# Patient Record
Sex: Female | Born: 1960 | Race: White | Hispanic: No | Marital: Married | Smoking: Former smoker
Health system: Southern US, Community
[De-identification: ages and names within clinical notes are randomized; demographics above are authoritative.]

## PROBLEM LIST (undated history)

## (undated) DIAGNOSIS — Z8711 Personal history of peptic ulcer disease: Secondary | ICD-10-CM

## (undated) DIAGNOSIS — I10 Essential (primary) hypertension: Secondary | ICD-10-CM

## (undated) DIAGNOSIS — G5603 Carpal tunnel syndrome, bilateral upper limbs: Secondary | ICD-10-CM

## (undated) DIAGNOSIS — Z8669 Personal history of other diseases of the nervous system and sense organs: Secondary | ICD-10-CM

## (undated) DIAGNOSIS — Z862 Personal history of diseases of the blood and blood-forming organs and certain disorders involving the immune mechanism: Secondary | ICD-10-CM

## (undated) DIAGNOSIS — E538 Deficiency of other specified B group vitamins: Secondary | ICD-10-CM

## (undated) DIAGNOSIS — G4700 Insomnia, unspecified: Secondary | ICD-10-CM

## (undated) HISTORY — DX: Personal history of peptic ulcer disease: Z87.11

## (undated) HISTORY — DX: Carpal tunnel syndrome, bilateral upper limbs: G56.03

## (undated) HISTORY — DX: Insomnia, unspecified: G47.00

## (undated) HISTORY — DX: Personal history of diseases of the blood and blood-forming organs and certain disorders involving the immune mechanism: Z86.2

## (undated) HISTORY — DX: Personal history of other diseases of the nervous system and sense organs: Z86.69

## (undated) HISTORY — DX: Deficiency of other specified B group vitamins: E53.8

## (undated) HISTORY — PX: OTHER SURGICAL HISTORY: SHX169

---

## 2010-09-11 HISTORY — PX: LUMBAR FUSION: SHX111

## 2014-04-27 ENCOUNTER — Ambulatory Visit: Payer: Self-pay | Admitting: Pain Medicine

## 2014-06-17 ENCOUNTER — Encounter: Payer: Self-pay | Admitting: Physical Medicine & Rehabilitation

## 2014-06-19 ENCOUNTER — Ambulatory Visit: Payer: Self-pay | Admitting: Pain Medicine

## 2014-06-19 LAB — BASIC METABOLIC PANEL
Anion Gap: 7 (ref 7–16)
BUN: 7 mg/dL (ref 7–18)
Calcium, Total: 8.4 mg/dL — ABNORMAL LOW (ref 8.5–10.1)
Chloride: 109 mmol/L — ABNORMAL HIGH (ref 98–107)
Co2: 27 mmol/L (ref 21–32)
Creatinine: 0.6 mg/dL (ref 0.60–1.30)
EGFR (African American): >60
GLUCOSE: 104 mg/dL — AB (ref 65–99)
Osmolality: 283 (ref 275–301)
POTASSIUM: 4.1 mmol/L (ref 3.5–5.1)
SODIUM: 143 mmol/L (ref 136–145)

## 2014-06-19 LAB — HEPATIC FUNCTION PANEL A (ARMC)
ALBUMIN: 3.6 g/dL (ref 3.4–5.0)
AST: 31 U/L (ref 15–37)
Alkaline Phosphatase: 68 U/L
Bilirubin,Total: 0.2 mg/dL (ref 0.2–1.0)
SGPT (ALT): 41 U/L
Total Protein: 7.2 g/dL (ref 6.4–8.2)

## 2014-06-19 LAB — MAGNESIUM: MAGNESIUM: 1.9 mg/dL

## 2014-06-19 LAB — SEDIMENTATION RATE: Erythrocyte Sed Rate: 8 mm/hr (ref 0–30)

## 2014-06-22 ENCOUNTER — Ambulatory Visit: Payer: Self-pay | Admitting: Pain Medicine

## 2014-07-06 ENCOUNTER — Ambulatory Visit: Payer: Self-pay | Admitting: Pain Medicine

## 2014-07-17 ENCOUNTER — Ambulatory Visit (HOSPITAL_BASED_OUTPATIENT_CLINIC_OR_DEPARTMENT_OTHER): Payer: Worker's Compensation | Admitting: Physical Medicine & Rehabilitation

## 2014-07-17 ENCOUNTER — Encounter: Payer: Worker's Compensation | Attending: Physical Medicine & Rehabilitation

## 2014-07-17 ENCOUNTER — Encounter: Payer: Self-pay | Admitting: Physical Medicine & Rehabilitation

## 2014-07-17 VITALS — HR 90 | Resp 14 | Ht 67.0 in | Wt 188.0 lb

## 2014-07-17 DIAGNOSIS — M79604 Pain in right leg: Secondary | ICD-10-CM | POA: Insufficient documentation

## 2014-07-17 NOTE — Progress Notes (Signed)
   Subjective:    Patient ID: Shawna Carrillo, female    DOB: 11/14/1960, 53 y.o.   MRN: [id number]  HPI  Pain Inventory Average Pain 6 Pain Right Now 7 My pain is constant, burning, stabbing, tingling and aching  In the last 24 hours, has pain interfered with the following? General activity 7 Relation with others 0 Enjoyment of life 5 What TIME of day is your pain at its worst? morning  AND NIGHT  Sleep (in general) Poor  Pain is worse with: walking, bending, sitting, standing and some activites Pain improves with: pacing activities, medication and POSITIONAL CHANGES Relief from Meds: 5  Mobility use a cane how many minutes can you walk? 3-5 ability to climb steps?  yes do you drive?  yes  Function disabled: date disabled 2012  Neuro/Psych weakness numbness tremor tingling trouble walking spasms  Prior Studies Any changes since last visit?  no  Physicians involved in your care Any changes since last visit?  no   Family History  Problem Relation Age of Onset  . Diabetes Mother   . Heart disease Father    History   Social History  . Marital Status: Unknown    Spouse Name: N/A    Number of Children: N/A  . Years of Education: N/A   Social History Main Topics  . Smoking status: Current Every Day Smoker -- 1.00 packs/day for 20 years    Types: Cigarettes  . Smokeless tobacco: None  . Alcohol Use: No  . Drug Use: No  . Sexual Activity: None   Other Topics Concern  . None   Social History Narrative  . None   Past Surgical History  Procedure Laterality Date  . Lumbar fusion  2012    FAILED   History reviewed. No pertinent past medical history. Pulse 90  Resp 14  Ht 5\' 7"  (1.702 m)  Wt 188 lb (85.276 kg)  BMI 29.44 kg/m2  SpO2 93%  Opioid Risk Score:   Fall Risk Score: Low Fall Risk (0-5 points)   Review of Systems     Objective:   Physical Exam        Assessment & Plan:    EMG performed 07/17/2014.  See EMG report under  media tab.  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

## 2014-07-21 ENCOUNTER — Encounter: Payer: Self-pay | Admitting: Physical Medicine & Rehabilitation

## 2014-12-29 ENCOUNTER — Ambulatory Visit
Admit: 2014-12-29 | Disposition: A | Discharge status: Home / Self Care | Payer: Self-pay | Attending: Hematology and Oncology | Admitting: Hematology and Oncology

## 2014-12-29 LAB — IRON AND TIBC
Iron Bind.Cap.(Total): 349 (ref 250–450)
Iron Saturation: 23.8
Iron: 83 ug/dL
Unbound Iron-Bind.Cap.: 266.3

## 2014-12-29 LAB — CBC CANCER CENTER
Basophil #: 0.1 x10 3/mm (ref 0.0–0.1)
Basophil %: 1 %
Eosinophil #: 0.1 x10 3/mm (ref 0.0–0.7)
Eosinophil %: 2.4 %
HCT: 39.7 % (ref 35.0–47.0)
HGB: 13.6 g/dL (ref 12.0–16.0)
Lymphocyte #: 2 x10 3/mm (ref 1.0–3.6)
Lymphocyte %: 35.6 %
MCH: 31.7 pg (ref 26.0–34.0)
MCHC: 34.1 g/dL (ref 32.0–36.0)
MCV: 93 fL (ref 80–100)
Monocyte #: 0.3 x10 3/mm (ref 0.2–0.9)
Monocyte %: 5.6 %
Neutrophil #: 3.1 x10 3/mm (ref 1.4–6.5)
Neutrophil %: 55.4 %
Platelet: 380 x10 3/mm (ref 150–440)
RBC: 4.28 10*6/uL (ref 3.80–5.20)
RDW: 13.6 % (ref 11.5–14.5)
WBC: 5.7 x10 3/mm (ref 3.6–11.0)

## 2014-12-29 LAB — SEDIMENTATION RATE: Erythrocyte Sed Rate: 11 mm/hr (ref 0–30)

## 2014-12-29 LAB — FOLATE: Folic Acid: 11.2 ng/mL

## 2014-12-29 LAB — FERRITIN: Ferritin (ARMC): 50 ng/mL

## 2015-02-17 ENCOUNTER — Encounter: Payer: Self-pay | Admitting: Family Medicine

## 2015-02-17 ENCOUNTER — Ambulatory Visit (INDEPENDENT_AMBULATORY_CARE_PROVIDER_SITE_OTHER): Payer: BLUE CROSS/BLUE SHIELD | Admitting: Family Medicine

## 2015-02-17 VITALS — BP 102/68 | HR 96 | Resp 15 | Ht 67.0 in | Wt 187.2 lb

## 2015-02-17 DIAGNOSIS — M5442 Lumbago with sciatica, left side: Secondary | ICD-10-CM

## 2015-02-17 DIAGNOSIS — M5441 Lumbago with sciatica, right side: Secondary | ICD-10-CM

## 2015-02-17 DIAGNOSIS — K220 Achalasia of cardia: Secondary | ICD-10-CM | POA: Insufficient documentation

## 2015-02-17 DIAGNOSIS — F5104 Psychophysiologic insomnia: Secondary | ICD-10-CM | POA: Insufficient documentation

## 2015-02-17 DIAGNOSIS — G8929 Other chronic pain: Secondary | ICD-10-CM | POA: Insufficient documentation

## 2015-02-17 DIAGNOSIS — G4700 Insomnia, unspecified: Secondary | ICD-10-CM | POA: Diagnosis not present

## 2015-02-17 DIAGNOSIS — G4762 Sleep related leg cramps: Secondary | ICD-10-CM | POA: Insufficient documentation

## 2015-02-17 DIAGNOSIS — R7989 Other specified abnormal findings of blood chemistry: Secondary | ICD-10-CM | POA: Insufficient documentation

## 2015-02-17 MED ORDER — ESZOPICLONE 3 MG PO TABS: 3.0000 mg | ORAL_TABLET | Freq: Every day | ORAL | Status: DC

## 2015-02-17 MED ORDER — ROPINIROLE HCL 1 MG PO TABS: 1.0000 mg | ORAL_TABLET | Freq: Every day | ORAL | Status: DC

## 2015-02-17 NOTE — Progress Notes (Signed)
Name: Shawna Carrillo   MRN: [id number]    DOB: November 01, 1960   Date:02/17/2015       Progress Note  Subjective  Chief Complaint  Chief Complaint  Patient presents with  . Follow-up    1 month   . Insomnia  HPI  Pt. Is here for follow up of Insomnia. She is on Lunesta '2mg'$  at bedtime and notes that it still takes her over 1 hour to fall asleep. She has unrefreshing sleep. In addition, she has restless leg syndrome and leg cramping. She is on Requip '1mg'$  at bedtime, and is not working as well asit did in the beginning.     Past Medical History  Diagnosis Date  . B12 deficiency   . Insomnia    Past Surgical History  Procedure Laterality Date  . Lumbar fusion  2012    FAILED   Family History  Problem Relation Age of Onset  . Diabetes Mother   . Heart disease Father       History  Substance Use Topics  . Smoking status: Current Every Day Smoker -- 1.00 packs/day for 20 years    Types: Cigarettes  . Smokeless tobacco: Never Used  . Alcohol Use: No     Current outpatient prescriptions:  .  rOPINIRole (REQUIP) 1 MG tablet, Take 1 capsule by mouth at bedtime., Disp: , Rfl:  .  DULoxetine (CYMBALTA) 60 MG capsule, Take 1 capsule by mouth daily., Disp: , Rfl:  .  eszopiclone (LUNESTA) 2 MG TABS tablet, Take 2 mg by mouth at bedtime., Disp: , Rfl: 0 .  gabapentin (NEURONTIN) 300 MG capsule, Take 300 mg by mouth daily., Disp: , Rfl:  .  traMADol (ULTRAM) 50 MG tablet, Take by mouth every 6 (six) hours as needed., Disp: , Rfl:  .  TraMADol HCl 100 MG CP24, Take 1 tablet by mouth 4 (four) times daily as needed., Disp: , Rfl:   Allergies  Allergen Reactions  . Codeine   . Latex     Review of Systems  Neurological: Negative for dizziness and seizures.  Psychiatric/Behavioral: Negative for depression. The patient has insomnia.       Objective  Filed Vitals:   02/17/15 1408  BP: 102/68  Pulse: 96  Resp: 15  Height: '5\' 7"'$  (1.702 m)  Weight: 187 lb 3.2 oz (84.913 kg)   SpO2: 96%     Physical Exam  Constitutional: She is oriented to person, place, and time and well-developed, well-nourished, and in no distress.  HENT:  Head: Normocephalic.  Cardiovascular: Normal rate.   Pulmonary/Chest: Effort normal.  Musculoskeletal:       Right ankle: She exhibits no swelling.       Left ankle: She exhibits no swelling.  Neurological: She is alert and oriented to person, place, and time.  Psychiatric: Affect normal.  Nursing note and vitals reviewed.        Assessment & Plan  1. Chronic insomnia We will increase Lunesta from 2 mg at bedtime to 3 mg at bedtime for insomnia. Refills provided. Follow-up in 4 weeks. - Eszopiclone (ESZOPICLONE) 3 MG TABS; Take 1 tablet (3 mg total) by mouth at bedtime. Take immediately before bedtime  Dispense: 30 tablet; Refill: 0  2. Nocturnal leg cramps Symptoms consistent with restless leg syndrome, previously well controlled on Requip. We will obtain labs including electrolytes and vitamin B12 levels and follow-up. - rOPINIRole (REQUIP) 1 MG tablet; Take 1 tablet (1 mg total) by mouth at bedtime.  Dispense: 30  tablet; Refill: 2 - Vitamin B12 - Comprehensive metabolic panel    Marvens Hollars Asad [facility] 02/17/2015 2:19 PM

## 2015-02-18 LAB — COMPREHENSIVE METABOLIC PANEL
ALK PHOS: 63 IU/L (ref 39–117)
ALT: 23 IU/L (ref 0–32)
AST: 24 IU/L (ref 0–40)
Albumin/Globulin Ratio: 1.6 (ref 1.1–2.5)
Albumin: 4.6 g/dL (ref 3.5–5.5)
BUN / CREAT RATIO: 11 (ref 9–23)
BUN: 8 mg/dL (ref 6–24)
CALCIUM: 10.2 mg/dL (ref 8.7–10.2)
CO2: 26 mmol/L (ref 18–29)
Chloride: 103 mmol/L (ref 97–108)
Creatinine, Ser: 0.71 mg/dL (ref 0.57–1.00)
GFR calc Af Amer: 111 mL/min/{1.73_m2} (ref >59)
GFR calc non Af Amer: 96 mL/min/{1.73_m2} (ref >59)
GLUCOSE: 83 mg/dL (ref 65–99)
Globulin, Total: 2.8 g/dL (ref 1.5–4.5)
Potassium: 4.5 mmol/L (ref 3.5–5.2)
SODIUM: 142 mmol/L (ref 134–144)
TOTAL PROTEIN: 7.4 g/dL (ref 6.0–8.5)

## 2015-02-18 LAB — VITAMIN B12: VITAMIN B 12: 243 pg/mL (ref 211–946)

## 2015-03-17 ENCOUNTER — Ambulatory Visit: Payer: BLUE CROSS/BLUE SHIELD | Admitting: Family Medicine

## 2015-03-23 ENCOUNTER — Ambulatory Visit: Payer: BLUE CROSS/BLUE SHIELD | Admitting: Family Medicine

## 2015-03-30 ENCOUNTER — Ambulatory Visit: Payer: BLUE CROSS/BLUE SHIELD | Admitting: Family Medicine

## 2015-04-06 ENCOUNTER — Ambulatory Visit: Payer: BLUE CROSS/BLUE SHIELD | Admitting: Family Medicine

## 2015-06-30 ENCOUNTER — Telehealth: Payer: Self-pay | Admitting: Pain Medicine

## 2015-06-30 NOTE — Telephone Encounter (Signed)
[facility] in [place] needs prior authorization to fill scripts please

## 2015-07-12 ENCOUNTER — Ambulatory Visit: Payer: Worker's Compensation | Attending: Pain Medicine | Admitting: Pain Medicine

## 2015-07-12 ENCOUNTER — Encounter: Payer: Self-pay | Admitting: Pain Medicine

## 2015-07-12 ENCOUNTER — Other Ambulatory Visit: Payer: Self-pay | Admitting: Pain Medicine

## 2015-07-12 VITALS — BP 134/85 | HR 89 | Temp 98.4°F | Resp 20 | Ht 66.0 in | Wt 190.0 lb

## 2015-07-12 DIAGNOSIS — F1190 Opioid use, unspecified, uncomplicated: Secondary | ICD-10-CM

## 2015-07-12 DIAGNOSIS — D649 Anemia, unspecified: Secondary | ICD-10-CM | POA: Diagnosis not present

## 2015-07-12 DIAGNOSIS — E785 Hyperlipidemia, unspecified: Secondary | ICD-10-CM | POA: Diagnosis not present

## 2015-07-12 DIAGNOSIS — Z8711 Personal history of peptic ulcer disease: Secondary | ICD-10-CM | POA: Diagnosis not present

## 2015-07-12 DIAGNOSIS — M961 Postlaminectomy syndrome, not elsewhere classified: Secondary | ICD-10-CM

## 2015-07-12 DIAGNOSIS — G8929 Other chronic pain: Secondary | ICD-10-CM | POA: Diagnosis not present

## 2015-07-12 DIAGNOSIS — E782 Mixed hyperlipidemia: Secondary | ICD-10-CM | POA: Insufficient documentation

## 2015-07-12 DIAGNOSIS — Z862 Personal history of diseases of the blood and blood-forming organs and certain disorders involving the immune mechanism: Secondary | ICD-10-CM

## 2015-07-12 DIAGNOSIS — K219 Gastro-esophageal reflux disease without esophagitis: Secondary | ICD-10-CM | POA: Diagnosis not present

## 2015-07-12 DIAGNOSIS — F1120 Opioid dependence, uncomplicated: Secondary | ICD-10-CM

## 2015-07-12 DIAGNOSIS — M545 Low back pain: Secondary | ICD-10-CM | POA: Diagnosis not present

## 2015-07-12 DIAGNOSIS — G43909 Migraine, unspecified, not intractable, without status migrainosus: Secondary | ICD-10-CM | POA: Diagnosis not present

## 2015-07-12 DIAGNOSIS — M47816 Spondylosis without myelopathy or radiculopathy, lumbar region: Secondary | ICD-10-CM | POA: Insufficient documentation

## 2015-07-12 DIAGNOSIS — Z5181 Encounter for therapeutic drug level monitoring: Secondary | ICD-10-CM

## 2015-07-12 DIAGNOSIS — M47896 Other spondylosis, lumbar region: Secondary | ICD-10-CM

## 2015-07-12 DIAGNOSIS — Z8669 Personal history of other diseases of the nervous system and sense organs: Secondary | ICD-10-CM

## 2015-07-12 DIAGNOSIS — M79606 Pain in leg, unspecified: Secondary | ICD-10-CM | POA: Diagnosis present

## 2015-07-12 DIAGNOSIS — Z79899 Other long term (current) drug therapy: Secondary | ICD-10-CM | POA: Diagnosis not present

## 2015-07-12 DIAGNOSIS — M549 Dorsalgia, unspecified: Secondary | ICD-10-CM

## 2015-07-12 DIAGNOSIS — G894 Chronic pain syndrome: Secondary | ICD-10-CM | POA: Insufficient documentation

## 2015-07-12 DIAGNOSIS — Z87891 Personal history of nicotine dependence: Secondary | ICD-10-CM | POA: Diagnosis not present

## 2015-07-12 DIAGNOSIS — M539 Dorsopathy, unspecified: Secondary | ICD-10-CM

## 2015-07-12 DIAGNOSIS — Z79891 Long term (current) use of opiate analgesic: Secondary | ICD-10-CM | POA: Diagnosis not present

## 2015-07-12 HISTORY — DX: Personal history of other diseases of the nervous system and sense organs: Z86.69

## 2015-07-12 HISTORY — DX: Personal history of peptic ulcer disease: Z87.11

## 2015-07-12 HISTORY — DX: Personal history of diseases of the blood and blood-forming organs and certain disorders involving the immune mechanism: Z86.2

## 2015-07-12 MED ORDER — TRAMADOL HCL 50 MG PO TABS: 100.0000 mg | ORAL_TABLET | Freq: Four times a day (QID) | ORAL | Status: DC | PRN

## 2015-07-12 MED ORDER — DULOXETINE HCL 60 MG PO CPEP: 60.0000 mg | ORAL_CAPSULE | Freq: Every day | ORAL | Status: DC

## 2015-07-12 NOTE — Progress Notes (Signed)
Safety precautions to be maintained throughout the outpatient stay will include: orient to surroundings, keep bed in low position, maintain call bell within reach at all times, provide assistance with transfer out of bed and ambulation. Did not bring pills for pill count

## 2015-07-12 NOTE — Progress Notes (Signed)
Patient's Name: Shawna Carrillo MRN: [id number] DOB: 08/23/1961 DOS: 07/12/2015  Primary Reason(s) for Visit: Encounter for Medication Management. CC: Back Pain and Leg Pain   HPI:   Shawna Carrillo is a 54 y.o. year old, female patient, who returns today as an established patient. She has Chronic low back pain; Nocturnal leg cramps; Chronic insomnia; Elevated platelet count (Shawna Carrillo); Achalasia; Chronic pain; Long term current use of opiate analgesic; Long term prescription opiate use; Opiate use; Encounter for therapeutic drug level monitoring; Chronic leg pain; Failed back surgical syndrome; Opiate dependence (Shawna Carrillo); Lumbar spondylosis; Lumbar facet syndrome; History of migraine; Hyperlipidemia; GERD (gastroesophageal reflux disease); History of peptic ulcer disease; and History of anemia on her problem list.. Her primarily concern today is the Back Pain and Leg Pain    The patient comes in indicating that she is doing great and she does not need any changes in her medications or any injections at this point. She indicated that she will use a call if she needs [place]. Today's Pain Score: 6  Pain Type: Chronic pain Pain Location: Back (leg) Pain Orientation: Lower Pain Descriptors / Indicators: Radiating, Throbbing (deep) Pain Frequency: Constant  Date of Last Visit: Date of Last Visit: 04/27/15 Service Provided on Last Visit: Service Provided on Last Visit: Med Refill  Pharmacotherapy Review:   Side-effects or Adverse reactions: None reported. Effectiveness: Described as relatively effective, allowing for increase in activities of daily living (ADL). Onset of action: Within expected pharmacological parameters. Duration of action: Within normal limits for medication. Peak effect: Timing and results are as within normal expected parameters. [place] PMP: Compliant with practice rules and regulations. DST: Compliant with practice rules and regulations. Lab work: No new labs ordered by our  practice. Treatment compliance: Compliant. Substance Use Disorder (SUD) Risk Level: Low Planned course of action: Continue therapy as is.  Allergies: Shawna Carrillo is allergic to latex and codeine.  Meds: The patient has a current medication list which includes the following prescription(s): duloxetine and tramadol. Requested Prescriptions   Signed Prescriptions Disp Refills  . DULoxetine (CYMBALTA) 60 MG capsule 30 capsule 5    Sig: Take 1 capsule (60 mg total) by mouth daily.  . traMADol (ULTRAM) 50 MG tablet 240 tablet 5    Sig: Take 2 tablets (100 mg total) by mouth every 6 (six) hours as needed.    ROS: Constitutional: Afebrile, no chills, well hydrated and well nourished Gastrointestinal: negative Musculoskeletal:negative Neurological: negative Behavioral/Psych: negative  PFSH: Medical:  Shawna Carrillo  has a past medical history of B12 deficiency; Insomnia; History of migraine (07/12/2015); History of peptic ulcer disease (07/12/2015); and History of anemia (07/12/2015). Family: family history includes Diabetes in her mother; Heart disease in her father. Surgical:  has past surgical history that includes Lumbar fusion (2012) and hilar myectomy. Tobacco:  reports that she has quit smoking. Her smoking use included Cigarettes. She has a 20 pack-year smoking history. She has never used smokeless tobacco. Alcohol:  reports that she does not drink alcohol. Drug:  reports that she does not use illicit drugs.  Physical Exam: Vitals:  Today's Vitals   07/12/15 1348 07/12/15 1349  BP:  134/85  Pulse: 89   Temp: 98.4 F (36.9 C)   Resp: 20   Height: '5\' 6"'$  (1.676 m)   Weight: 190 lb (86.183 kg)   SpO2: 100%   PainSc: 6  6   PainLoc: Back   Calculated BMI: Body mass index is 30.68 kg/(m^2). General appearance: alert, cooperative, appears stated age,  no distress and mildly obese Eyes: conjunctivae/corneas clear. PERRL, EOM's intact. Fundi benign. Lungs: No evidence  respiratory distress, no audible rales or ronchi and no use of accessory muscles of respiration Neck: no adenopathy, no carotid bruit, no JVD, supple, symmetrical, trachea midline and thyroid not enlarged, symmetric, no tenderness/mass/nodules Back: symmetric, no curvature. ROM normal. No CVA tenderness. Extremities: extremities normal, atraumatic, no cyanosis or edema Pulses: 2+ and symmetric Skin: Skin color, texture, turgor normal. No rashes or lesions Neurologic: Grossly normal    Assessment: Encounter Diagnosis:  Primary Diagnosis: Chronic pain [G89.29]  Plan: Shawna Carrillo was seen today for back pain and leg pain.  Diagnoses and all orders for this visit:  Chronic pain -     DULoxetine (CYMBALTA) 60 MG capsule; Take 1 capsule (60 mg total) by mouth daily. -     traMADol (ULTRAM) 50 MG tablet; Take 2 tablets (100 mg total) by mouth every 6 (six) hours as needed.  Long term current use of opiate analgesic -     Drugs of abuse screen w/o alc, rtn urine-sln; Future  Long term prescription opiate use  Opiate use  Encounter for therapeutic drug level monitoring  Chronic low back pain  Chronic leg pain, unspecified laterality  Failed back surgical syndrome  Uncomplicated opioid dependence ([place])  Other osteoarthritis of spine, lumbar region  Lumbar facet syndrome  History of migraine  Hyperlipidemia  Gastroesophageal reflux disease without esophagitis  History of peptic ulcer disease  History of anemia     There are no Patient Instructions on file for this visit. Medications discontinued today:  Medications Discontinued During This Encounter  Medication Reason  . TraMADol HCl 016 MG WF09 Duplicate  . DULoxetine (CYMBALTA) 60 MG capsule Reorder  . traMADol (ULTRAM) 50 MG tablet Reorder  . Eszopiclone (ESZOPICLONE) 3 MG TABS Error  . rOPINIRole (REQUIP) 1 MG tablet Error   Medications administered today:  [name] had no medications administered during this  visit.  Primary Care Physician: Keith Rake, MD Location: [facility] Note by: Gaytha Raybourn A. Dossie Arbour, M.D, DABA, DABAPM, DABPM, DABIPP, FIPP

## 2015-07-18 LAB — TOXASSURE SELECT 13 (MW), URINE: PDF: 0

## 2015-07-26 NOTE — Progress Notes (Signed)
Quick Note:  Results of UDT were consistent with expected results. No further intervention warranted at this time. ______

## 2015-08-02 ENCOUNTER — Other Ambulatory Visit: Payer: Self-pay | Admitting: Pain Medicine

## 2015-12-01 ENCOUNTER — Other Ambulatory Visit: Payer: Self-pay

## 2015-12-01 ENCOUNTER — Ambulatory Visit: Payer: BLUE CROSS/BLUE SHIELD | Admitting: Gastroenterology

## 2015-12-30 ENCOUNTER — Ambulatory Visit: Payer: Self-pay | Admitting: Pain Medicine

## 2016-01-06 ENCOUNTER — Other Ambulatory Visit: Payer: Self-pay

## 2016-01-06 ENCOUNTER — Ambulatory Visit (INDEPENDENT_AMBULATORY_CARE_PROVIDER_SITE_OTHER): Payer: BLUE CROSS/BLUE SHIELD | Admitting: Gastroenterology

## 2016-01-06 ENCOUNTER — Encounter: Payer: Self-pay | Admitting: Gastroenterology

## 2016-01-06 VITALS — BP 132/90 | HR 98 | Temp 98.3°F | Ht 67.0 in | Wt 201.0 lb

## 2016-01-06 DIAGNOSIS — D519 Vitamin B12 deficiency anemia, unspecified: Secondary | ICD-10-CM

## 2016-01-06 NOTE — Progress Notes (Signed)
Gastroenterology Consultation  Referring Provider:     Roselee Nova, MD Primary Care Physician:  Keith Rake, MD Primary Gastroenterologist:  Dr. Allen Norris     Reason for Consultation:     B12 deficiency        HPI:   Shawna Carrillo is a 55 y.o. y/o female referred for consultation & management of B12 anemia by Dr. Keith Rake, MD.  This patient comes today with a history of being told she had B12 deficiency. The patient has recent labs that did not show any anemia and her B12 is within normal limits. The patient states that she was told that she had B12 deficiency in the past and she reports that a lot of her labs are abnormal while she was on Chantix for smoking cessation. The patient denies any nausea vomiting fevers or chills. The patient has a history of achalasia and was treated with surgery at the [facility]. He reports that she has episodes of gas trapped in her esophagus which gives her some chest discomfort but this has been a regular occurrence for her. There is no report of any unexplained weight loss. The patient also has a history of a colon polyp at her last colonoscopy in 2014. Patient's last upper endoscopy did not show any abnormalities in the biopsies of the stomach such as atrophic gastritis or any indications that she has pernicious anemia.  Past Medical History  Diagnosis Date  . B12 deficiency   . Insomnia   . History of migraine 07/12/2015  . History of peptic ulcer disease 07/12/2015  . History of anemia 07/12/2015    Past Surgical History  Procedure Laterality Date  . Lumbar fusion  2012    FAILED  . Hilar myectomy      Prior to Admission medications   Medication Sig Start Date End Date Taking? Authorizing Provider  DULoxetine (CYMBALTA) 60 MG capsule Take 1 capsule (60 mg total) by mouth daily. 07/12/15  Yes Milinda Pointer, MD  meloxicam (MOBIC) 15 MG tablet take 1 tablet by mouth once daily with meals 12/30/15  Yes Historical Provider, MD    pramipexole (MIRAPEX) 0.125 MG tablet take 1-3 tablets by mouth at bedtime if needed for restlessness legs 10/27/15  Yes Historical Provider, MD  traMADol (ULTRAM) 50 MG tablet Take 2 tablets (100 mg total) by mouth every 6 (six) hours as needed. 07/12/15  Yes Milinda Pointer, MD    Family History  Problem Relation Age of Onset  . Diabetes Mother   . Heart disease Father      Social History  Substance Use Topics  . Smoking status: Former Smoker -- 1.00 packs/day for 20 years    Types: Cigarettes  . Smokeless tobacco: Never Used  . Alcohol Use: No    Allergies as of 01/06/2016 - Review Complete 01/06/2016  Allergen Reaction Noted  . Latex Hives 02/17/2015  . Codeine Palpitations 02/17/2015    Review of Systems:    All systems reviewed and negative except where noted in HPI.   Physical Exam:  BP 132/90 mmHg  Pulse 98  Temp(Src) 98.3 F (36.8 C) (Oral)  Ht '5\' 7"'$  (1.702 m)  Wt 201 lb (91.173 kg)  BMI 31.47 kg/m2 No LMP recorded. Patient is postmenopausal. Psych:  Alert and cooperative. Normal mood and affect. General:   Alert,  Well-developed, well-nourished, pleasant and cooperative in NAD Head:  Normocephalic and atraumatic. Eyes:  Sclera clear, no icterus.   Conjunctiva pink. Ears:  Normal  auditory acuity. Nose:  No deformity, discharge, or lesions. Mouth:  No deformity or lesions,oropharynx pink & moist. Neck:  Supple; no masses or thyromegaly. Lungs:  Respirations even and unlabored.  Clear throughout to auscultation.   No wheezes, crackles, or rhonchi. No acute distress. Heart:  Regular rate and rhythm; no murmurs, clicks, rubs, or gallops. Abdomen:  Normal bowel sounds.  No bruits.  Soft, non-tender and non-distended without masses, hepatosplenomegaly or hernias noted.  No guarding or rebound tenderness.  Negative Carnett sign.   Rectal:  Deferred.  Msk:  Symmetrical without gross deformities.  Good, equal movement & strength bilaterally. Pulses:  Normal pulses  noted. Extremities:  No clubbing or edema.  No cyanosis. Neurologic:  Alert and oriented x3;  grossly normal neurologically. Skin:  Intact without significant lesions or rashes.  No jaundice. Lymph Nodes:  No significant cervical adenopathy. Psych:  Alert and cooperative. Normal mood and affect.  Imaging Studies: No results found.  Assessment and Plan:   Shawna Carrillo is a 55 y.o. y/o female who comes in today with a report of having B12 deficiency with anemia. The patient's most recent labs show her to have a normal blood count and normal B12. The patient is not due for colonoscopy or EGD at this time. The patient should have her repeat colonoscopy in 2019. At that time if she is doing well she may also elect to undergo an upper endoscopy due to her history of achalasia. For her history of B12 anemia with her recent labs being normal by believe no further workup is needed at this time. The patient has been explained the plan and agrees with it.   Note: This dictation was prepared with Dragon dictation along with smaller phrase technology. Any transcriptional errors that result from this process are unintentional.

## 2016-01-21 ENCOUNTER — Other Ambulatory Visit: Payer: Self-pay | Admitting: Pain Medicine

## 2016-01-27 ENCOUNTER — Ambulatory Visit: Payer: Worker's Compensation | Attending: Pain Medicine | Admitting: Pain Medicine

## 2016-01-27 ENCOUNTER — Encounter: Payer: Self-pay | Admitting: Pain Medicine

## 2016-01-27 VITALS — BP 133/87 | HR 95 | Temp 98.2°F | Resp 18 | Ht 67.0 in | Wt 190.0 lb

## 2016-01-27 DIAGNOSIS — R05 Cough: Secondary | ICD-10-CM | POA: Insufficient documentation

## 2016-01-27 DIAGNOSIS — G4762 Sleep related leg cramps: Secondary | ICD-10-CM | POA: Diagnosis not present

## 2016-01-27 DIAGNOSIS — G8929 Other chronic pain: Secondary | ICD-10-CM | POA: Insufficient documentation

## 2016-01-27 DIAGNOSIS — Z8711 Personal history of peptic ulcer disease: Secondary | ICD-10-CM | POA: Diagnosis not present

## 2016-01-27 DIAGNOSIS — Z87891 Personal history of nicotine dependence: Secondary | ICD-10-CM | POA: Diagnosis not present

## 2016-01-27 DIAGNOSIS — F1190 Opioid use, unspecified, uncomplicated: Secondary | ICD-10-CM

## 2016-01-27 DIAGNOSIS — E785 Hyperlipidemia, unspecified: Secondary | ICD-10-CM | POA: Diagnosis not present

## 2016-01-27 DIAGNOSIS — Z79891 Long term (current) use of opiate analgesic: Secondary | ICD-10-CM | POA: Insufficient documentation

## 2016-01-27 DIAGNOSIS — Z5181 Encounter for therapeutic drug level monitoring: Secondary | ICD-10-CM

## 2016-01-27 DIAGNOSIS — Z9889 Other specified postprocedural states: Secondary | ICD-10-CM | POA: Insufficient documentation

## 2016-01-27 DIAGNOSIS — M545 Low back pain: Secondary | ICD-10-CM | POA: Insufficient documentation

## 2016-01-27 DIAGNOSIS — M539 Dorsopathy, unspecified: Secondary | ICD-10-CM

## 2016-01-27 DIAGNOSIS — M47816 Spondylosis without myelopathy or radiculopathy, lumbar region: Secondary | ICD-10-CM

## 2016-01-27 DIAGNOSIS — M961 Postlaminectomy syndrome, not elsewhere classified: Secondary | ICD-10-CM

## 2016-01-27 DIAGNOSIS — G43909 Migraine, unspecified, not intractable, without status migrainosus: Secondary | ICD-10-CM | POA: Insufficient documentation

## 2016-01-27 DIAGNOSIS — Z6829 Body mass index (BMI) 29.0-29.9, adult: Secondary | ICD-10-CM | POA: Diagnosis not present

## 2016-01-27 DIAGNOSIS — E538 Deficiency of other specified B group vitamins: Secondary | ICD-10-CM | POA: Insufficient documentation

## 2016-01-27 DIAGNOSIS — K219 Gastro-esophageal reflux disease without esophagitis: Secondary | ICD-10-CM | POA: Diagnosis not present

## 2016-01-27 DIAGNOSIS — M549 Dorsalgia, unspecified: Secondary | ICD-10-CM | POA: Diagnosis present

## 2016-01-27 DIAGNOSIS — G4700 Insomnia, unspecified: Secondary | ICD-10-CM | POA: Diagnosis not present

## 2016-01-27 MED ORDER — DULOXETINE HCL 60 MG PO CPEP: 60.0000 mg | ORAL_CAPSULE | Freq: Every day | ORAL | Status: DC

## 2016-01-27 MED ORDER — TRAMADOL HCL 50 MG PO TABS: 100.0000 mg | ORAL_TABLET | Freq: Four times a day (QID) | ORAL | Status: DC | PRN

## 2016-01-27 NOTE — Progress Notes (Signed)
Patient's Name: Shawna Carrillo  Patient type: Established  MRN: [id number]  Service setting: Ambulatory outpatient  DOB: 1961-06-08  Location: [facility]  DOS: 01/27/2016  Primary Care Physician: Keith Rake, MD  Note by: Kathlen Brunswick. Dossie Arbour, M.D, DABA, DABAPM, DABPM, Milagros Evener, FIPP  Referring Physician: Roselee Nova, MD  Specialty: Board-Certified Interventional Pain Management  Last Visit to Pain Management: 01/21/2016   Primary Reason(s) for Visit: Encounter for prescription drug management (Level of risk: moderate) CC: Back Pain   HPI  Shawna Carrillo is a 55 y.o. year old, female patient, who returns today as an established patient. She has Chronic low back pain; Nocturnal leg cramps; Chronic insomnia; Elevated platelet count ([place]); Achalasia; Chronic pain; Long term current use of opiate analgesic; Long term prescription opiate use; Opiate use (40 MME/Day); Encounter for therapeutic drug level monitoring; Chronic leg pain; Failed back surgical syndrome; Lumbar spondylosis; Lumbar facet syndrome; History of migraine; Hyperlipidemia; GERD (gastroesophageal reflux disease); History of peptic ulcer disease; and History of anemia on her problem list.. Her primarily concern today is the Back Pain   Pain Assessment: Self-Reported Pain Score: 5  Reported level is compatible with observation Pain Type: Chronic pain Pain Location: Back Pain Orientation: Lower Pain Descriptors / Indicators: Throbbing Pain Frequency: Constant  The patient comes into the clinics today for pharmacological management of her chronic pain. I last saw this patient on 01/21/2016. The patient  reports that she does not use illicit drugs. Her body mass index is 29.75 kg/(m^2).  Date of Last Visit: 07/12/15 Service Provided on Last Visit: Med Refill  Controlled Substance Pharmacotherapy Assessment & REMS (Risk Evaluation and Mitigation Strategy)  Analgesic: Tramadol 100 mg every 6 hours (400  mg/day of tramadol) Pill Count: Tramadol pill count # 77/240. Filled 12-23-15. MME/day: 40 mg/day Pharmacokinetics: Onset of action (Liberation/Absorption): Within expected pharmacological parameters Time to Peak effect (Distribution): Timing and results are as within normal expected parameters Duration of action (Metabolism/Excretion): Within normal limits for medication Pharmacodynamics: Analgesic Effect: More than 50% Activity Facilitation: Medication(s) allow patient to sit, stand, walk, and do the basic ADLs Perceived Effectiveness: Described as relatively effective, allowing for increase in activities of daily living (ADL) Side-effects or Adverse reactions: None reported Monitoring: [place] PMP: Online review of the past 69-monthperiod conducted. Compliant with practice rules and regulations UDS Results/interpretation: The patient's last UDS was done on 07/12/2015 and it came back within normal limits with no unexpected results. Medication Assessment Form: Reviewed. Patient indicates being compliant with therapy Treatment compliance: Compliant Risk Assessment: Aberrant Behavior: None observed today Substance Use Disorder (SUD) Risk Level: No change since last visit Risk of opioid abuse or dependence: 0.7-3.0% with doses ? 36 MME/day and 6.1-26% with doses ? 120 MME/day. Opioid Risk Tool (ORT) Score: Total Score: 0 Low Risk for SUD (Score <3) Depression Scale Score: PHQ-2: PHQ-2 Total Score: 0 No depression (0) PHQ-9: PHQ-9 Total Score: 0 No depression (0-4)  Pharmacologic Plan: No change in therapy, at this time  Laboratory Chemistry  Inflammation Markers Lab Results  Component Value Date   ESRSEDRATE 11 12/29/2014    Renal Function Lab Results  Component Value Date   BUN 8 02/17/2015   CREATININE 0.71 02/17/2015   GFRAA 111 02/17/2015   GFRNONAA 96 02/17/2015    Hepatic Function Lab Results  Component Value Date   AST 24 02/17/2015   ALT 23 02/17/2015   ALBUMIN 4.6  02/17/2015    Electrolytes Lab Results  Component Value  Date   NA 142 02/17/2015   K 4.5 02/17/2015   CL 103 02/17/2015   CALCIUM 10.2 02/17/2015   MG 1.9 06/19/2014    Pain Modulating Vitamins Lab Results  Component Value Date   VITAMINB12 243 02/17/2015    Coagulation Parameters No results found for: INR, LABPROT  Note: I personally reviewed the above data. Results made available to patient.  Recent Diagnostic Imaging  Dg Chest 2 View  12/29/2014  CLINICAL DATA:  Chronic cough.  Smoker.  High platelet count. EXAM: CHEST  2 VIEW COMPARISON:  None. FINDINGS: Normal sized heart. Clear lungs. Minimal diffuse peribronchial thickening. Mild thoracic spine degenerative changes. IMPRESSION: Minimal chronic bronchitic changes. Electronically Signed   By: Claudie Revering M.D.   On: 12/29/2014 16:32    Meds  The patient has a current medication list which includes the following prescription(s): duloxetine, pramipexole, and tramadol.  Current Outpatient Prescriptions on File Prior to Visit  Medication Sig  . pramipexole (MIRAPEX) 0.125 MG tablet take 1-3 tablets by mouth at bedtime if needed for restlessness legs   No current facility-administered medications on file prior to visit.    ROS  Constitutional: Denies any fever or chills Gastrointestinal: No reported hemesis, hematochezia, vomiting, or acute GI distress Musculoskeletal: Denies any acute onset joint swelling, redness, loss of ROM, or weakness Neurological: No reported episodes of acute onset apraxia, aphasia, dysarthria, agnosia, amnesia, paralysis, loss of coordination, or loss of consciousness  Allergies  Shawna Carrillo is allergic to latex and codeine.  [place]  Medical:  Shawna Carrillo  has a past medical history of B12 deficiency; Insomnia; History of migraine (07/12/2015); History of peptic ulcer disease (07/12/2015); and History of anemia (07/12/2015). Family: family history includes Diabetes in her mother; Heart  disease in her father. Surgical:  has past surgical history that includes Lumbar fusion (2012) and hilar myectomy. Tobacco:  reports that she has quit smoking. Her smoking use included Cigarettes. She has a 20 pack-year smoking history. She has never used smokeless tobacco. Alcohol:  reports that she does not drink alcohol. Drug:  reports that she does not use illicit drugs.  Constitutional Exam  Vitals: Blood pressure 133/87, pulse 95, temperature 98.2 F (36.8 C), resp. rate 18, height '5\' 7"'$  (1.702 m), weight 190 lb (86.183 kg), SpO2 99 %. General appearance: Well nourished, well developed, and well hydrated. In no acute distress Calculated BMI/Body habitus: Body mass index is 29.75 kg/(m^2). (25-29.9 kg/m2) Overweight - 20% higher incidence of chronic pain Psych/Mental status: Alert and oriented x 3 (person, place, & time) Eyes: PERLA Respiratory: No evidence of acute respiratory distress  Cervical Spine Exam  Inspection: No masses, redness, or swelling Alignment: Symmetrical ROM: Functional: ROM is within functional limits [facility]) Stability: No instability detected Muscle strength & Tone: Functionally intact Sensory: Unimpaired Palpation: No complaints of tenderness  Upper Extremity (UE) Exam    Side: Right upper extremity  Side: Left upper extremity  Inspection: No masses, redness, swelling, or asymmetry  Inspection: No masses, redness, swelling, or asymmetry  ROM:  ROM:  Functional: ROM is within functional limits [facility])  Functional: ROM is within functional limits [facility])  Muscle strength & Tone: Functionally intact  Muscle strength & Tone: Functionally intact  Sensory: Unimpaired  Sensory: Unimpaired  Palpation: Non-contributory  Palpation: Non-contributory   Thoracic Spine Exam  Inspection: No masses, redness, or swelling Alignment: Symmetrical ROM: Functional: ROM is within functional limits [facility]) Stability: No instability detected Sensory: Unimpaired Muscle strength &  Tone: Functionally intact  Palpation: No complaints of tenderness  Lumbar Spine Exam  Inspection: No masses, redness, or swelling. Well healed scar from prior surgery. Alignment: Symmetrical ROM: Functional: ROM is within functional limits [facility]) Stability: No instability detected Muscle strength & Tone: Functionally intact Sensory: Unimpaired Palpation: No complaints of tenderness Provocative Tests: Lumbar Hyperextension and rotation test: deferred Patrick's Maneuver: deferred  Gait & Posture Assessment  Ambulation: Unassisted Gait: Unaffected Posture: WNL  Lower Extremity Exam    Side: Right lower extremity  Side: Left lower extremity  Inspection: No masses, redness, swelling, or asymmetry ROM:  Inspection: No masses, redness, swelling, or asymmetry ROM:  Functional: ROM is within functional limits [facility])  Functional: ROM is within functional limits [facility])  Muscle strength & Tone: Functionally intact  Muscle strength & Tone: Functionally intact  Sensory: Unimpaired  Sensory: Unimpaired  Palpation: Non-contributory  Palpation: Non-contributory   Assessment & Plan  Primary Diagnosis & Pertinent Problem List: The primary encounter diagnosis was Chronic pain. Diagnoses of Encounter for therapeutic drug level monitoring, Long term current use of opiate analgesic, Chronic low back pain, Opiate use (40 MME/Day), Lumbar facet syndrome, and Failed back surgical syndrome were also pertinent to this visit.  Visit Diagnosis: 1. Chronic pain   2. Encounter for therapeutic drug level monitoring   3. Long term current use of opiate analgesic   4. Chronic low back pain   5. Opiate use (40 MME/Day)   6. Lumbar facet syndrome   7. Failed back surgical syndrome     Problems updated and reviewed during this visit: Problem  Opiate use (40 MME/Day)    Problem-specific Plan(s): No problem-specific assessment & plan notes found for this encounter.  No new assessment & plan notes have been  filed under this hospital service since the last note was generated. Service: Pain Management   Plan of Care   Problem List Items Addressed This Visit      High   Chronic low back pain (Chronic)   Relevant Medications   traMADol (ULTRAM) 50 MG tablet   Chronic pain - Primary (Chronic)   Relevant Medications   DULoxetine (CYMBALTA) 60 MG capsule   traMADol (ULTRAM) 50 MG tablet   Failed back surgical syndrome (Chronic)   Relevant Medications   traMADol (ULTRAM) 50 MG tablet   Other Relevant Orders   LUMBAR EPIDURAL STEROID INJECTION   Lumbar facet syndrome (Chronic)   Relevant Medications   traMADol (ULTRAM) 50 MG tablet   Other Relevant Orders   LUMBAR FACET(MEDIAL BRANCH NERVE BLOCK) MBNB     Medium   Encounter for therapeutic drug level monitoring   Long term current use of opiate analgesic (Chronic)   Opiate use (40 MME/Day) (Chronic)       Pharmacotherapy (Medications Ordered): Meds ordered this encounter  Medications  . DULoxetine (CYMBALTA) 60 MG capsule    Sig: Take 1 capsule (60 mg total) by mouth daily.    Dispense:  30 capsule    Refill:  5    Do not place this medication, or any other prescription from our practice, on "Automatic Refill". Patient may have prescription filled one day early if pharmacy is closed on scheduled refill date.  . traMADol (ULTRAM) 50 MG tablet    Sig: Take 2 tablets (100 mg total) by mouth every 6 (six) hours as needed.    Dispense:  240 tablet    Refill:  5    Do not place this medication, or any other prescription from our practice, on "Automatic  Refill". Patient may have prescription filled one day early if pharmacy is closed on scheduled refill date.    Lab-work & Procedure Ordered: Orders Placed This Encounter  Procedures  . LUMBAR FACET(MEDIAL BRANCH NERVE BLOCK) MBNB  . LUMBAR EPIDURAL STEROID INJECTION    Imaging Ordered: None  Interventional Therapies: Scheduled:  None at this time.    Considering:  Palliative  treatments.    PRN Procedures:   1. Diagnostic bilateral lumbar facet block under fluoroscopic guidance and IV sedation, for the low back pain.  2. Diagnostic caudal epidural steroid injection + epidurogram under fluoroscopic guidance and IV sedation, for the lower extremity pain.    Referral(s) or Consult(s): None at this time.  New Prescriptions   No medications on file    Medications administered during this visit: Shawna Carrillo had no medications administered during this visit.  Requested PM Follow-up: Return in about 6 months (around 07/29/2016) for Medication Management in 6 months.  Future Appointments Date Time Provider [place]  07/26/2016 10:20 AM Milinda Pointer, MD [facility] None    Primary Care Physician: Keith Rake, MD Location: [facility] Note by: Kathlen Brunswick. Dossie Arbour, M.D, DABA, DABAPM, DABPM, DABIPP, FIPP  Pain Score Disclaimer: We use the NRS-11 scale. This is a self-reported, subjective measurement of pain severity with only modest accuracy. It is used primarily to identify changes within a particular patient. It must be understood that outpatient pain scales are significantly less accurate that those used for research, where they can be applied under ideal controlled circumstances with minimal exposure to variables. In reality, the score is likely to be a combination of pain intensity and pain affect, where pain affect describes the degree of emotional arousal or changes in action readiness caused by the sensory experience of pain. Factors such as social and work situation, setting, emotional state, anxiety levels, expectation, and prior pain experience may influence pain perception and show large inter-individual differences that may also be affected by time variables.  Patient instructions provided during this appointment: There are no Patient Instructions on file for this visit.

## 2016-01-27 NOTE — Progress Notes (Signed)
Safety precautions to be maintained throughout the outpatient stay will include: orient to surroundings, keep bed in low position, maintain call bell within reach at all times, provide assistance with transfer out of bed and ambulation. Tramadol pill count # 77/240  Filled 12-23-15

## 2016-02-03 ENCOUNTER — Ambulatory Visit: Admit: 2016-02-03 | Payer: Self-pay | Admitting: Gastroenterology

## 2016-02-03 SURGERY — COLONOSCOPY WITH PROPOFOL
Anesthesia: Choice

## 2016-04-07 ENCOUNTER — Other Ambulatory Visit: Payer: Self-pay | Admitting: Pain Medicine

## 2016-07-20 ENCOUNTER — Encounter: Payer: Self-pay | Admitting: Pain Medicine

## 2016-07-26 ENCOUNTER — Encounter: Payer: Self-pay | Admitting: Pain Medicine

## 2016-08-01 ENCOUNTER — Other Ambulatory Visit: Payer: Self-pay | Admitting: Pain Medicine

## 2016-08-01 DIAGNOSIS — G8929 Other chronic pain: Secondary | ICD-10-CM

## 2016-09-01 ENCOUNTER — Other Ambulatory Visit: Payer: Self-pay | Admitting: Pain Medicine

## 2016-09-01 DIAGNOSIS — G8929 Other chronic pain: Secondary | ICD-10-CM

## 2016-09-21 ENCOUNTER — Ambulatory Visit: Payer: Worker's Compensation | Attending: Pain Medicine | Admitting: Pain Medicine

## 2016-09-21 ENCOUNTER — Encounter: Payer: Self-pay | Admitting: Pain Medicine

## 2016-09-21 VITALS — BP 121/83 | HR 86 | Temp 98.3°F | Resp 16 | Ht 67.0 in | Wt 185.0 lb

## 2016-09-21 DIAGNOSIS — Z5181 Encounter for therapeutic drug level monitoring: Secondary | ICD-10-CM | POA: Insufficient documentation

## 2016-09-21 DIAGNOSIS — Z885 Allergy status to narcotic agent status: Secondary | ICD-10-CM | POA: Insufficient documentation

## 2016-09-21 DIAGNOSIS — Z79891 Long term (current) use of opiate analgesic: Secondary | ICD-10-CM | POA: Diagnosis not present

## 2016-09-21 DIAGNOSIS — Z8249 Family history of ischemic heart disease and other diseases of the circulatory system: Secondary | ICD-10-CM | POA: Diagnosis not present

## 2016-09-21 DIAGNOSIS — G894 Chronic pain syndrome: Secondary | ICD-10-CM | POA: Diagnosis not present

## 2016-09-21 DIAGNOSIS — M545 Low back pain: Secondary | ICD-10-CM | POA: Diagnosis present

## 2016-09-21 DIAGNOSIS — G4700 Insomnia, unspecified: Secondary | ICD-10-CM | POA: Insufficient documentation

## 2016-09-21 DIAGNOSIS — M5442 Lumbago with sciatica, left side: Secondary | ICD-10-CM | POA: Diagnosis not present

## 2016-09-21 DIAGNOSIS — E785 Hyperlipidemia, unspecified: Secondary | ICD-10-CM | POA: Diagnosis not present

## 2016-09-21 DIAGNOSIS — M961 Postlaminectomy syndrome, not elsewhere classified: Secondary | ICD-10-CM | POA: Diagnosis not present

## 2016-09-21 DIAGNOSIS — M1288 Other specific arthropathies, not elsewhere classified, other specified site: Secondary | ICD-10-CM

## 2016-09-21 DIAGNOSIS — Z8711 Personal history of peptic ulcer disease: Secondary | ICD-10-CM | POA: Diagnosis not present

## 2016-09-21 DIAGNOSIS — M488X6 Other specified spondylopathies, lumbar region: Secondary | ICD-10-CM | POA: Diagnosis not present

## 2016-09-21 DIAGNOSIS — K219 Gastro-esophageal reflux disease without esophagitis: Secondary | ICD-10-CM | POA: Diagnosis not present

## 2016-09-21 DIAGNOSIS — Z833 Family history of diabetes mellitus: Secondary | ICD-10-CM | POA: Insufficient documentation

## 2016-09-21 DIAGNOSIS — Z87891 Personal history of nicotine dependence: Secondary | ICD-10-CM | POA: Insufficient documentation

## 2016-09-21 DIAGNOSIS — Z91040 Latex allergy status: Secondary | ICD-10-CM | POA: Insufficient documentation

## 2016-09-21 DIAGNOSIS — Z79899 Other long term (current) drug therapy: Secondary | ICD-10-CM | POA: Insufficient documentation

## 2016-09-21 DIAGNOSIS — M5441 Lumbago with sciatica, right side: Secondary | ICD-10-CM

## 2016-09-21 DIAGNOSIS — G8929 Other chronic pain: Secondary | ICD-10-CM

## 2016-09-21 DIAGNOSIS — Z862 Personal history of diseases of the blood and blood-forming organs and certain disorders involving the immune mechanism: Secondary | ICD-10-CM | POA: Insufficient documentation

## 2016-09-21 DIAGNOSIS — M47816 Spondylosis without myelopathy or radiculopathy, lumbar region: Secondary | ICD-10-CM | POA: Diagnosis not present

## 2016-09-21 DIAGNOSIS — E538 Deficiency of other specified B group vitamins: Secondary | ICD-10-CM | POA: Diagnosis not present

## 2016-09-21 DIAGNOSIS — M47896 Other spondylosis, lumbar region: Secondary | ICD-10-CM | POA: Insufficient documentation

## 2016-09-21 MED ORDER — DULOXETINE HCL 60 MG PO CPEP: 60.0000 mg | ORAL_CAPSULE | Freq: Every day | ORAL | 0 refills | Status: DC

## 2016-09-21 MED ORDER — TRAMADOL HCL 50 MG PO TABS: 100.0000 mg | ORAL_TABLET | Freq: Four times a day (QID) | ORAL | 5 refills | Status: DC | PRN

## 2016-09-21 NOTE — Progress Notes (Signed)
Patient's Name: Shawna Carrillo  MRN: [id number]  Referring Provider: Roselee Nova, MD  DOB: 1961-09-11  PCP: Lavera Guise, MD  DOS: 09/21/2016  Note by: Kathlen Brunswick. Dossie Arbour, MD  Service setting: Ambulatory outpatient  Specialty: Interventional Pain Management  Location: [facility] (AMB) Pain Management Facility    Patient type: Established   Primary Reason(s) for Visit: Encounter for prescription drug management (Level of risk: moderate) CC: Back Pain (low and worse on the right)  HPI  Shawna Carrillo is a 56 y.o. year old, female patient, who comes today for a medication management evaluation. She has Chronic bilateral low back pain with bilateral sciatica (P) (B) (R>L); Nocturnal leg cramps; Chronic insomnia; Elevated platelet count ([place]); Achalasia; Chronic pain; Long term current use of opiate analgesic; Long term prescription opiate use; Opiate use (40 MME/Day); Encounter for therapeutic drug level monitoring; Chronic leg pain (R) (L5 Big toe); Failed back surgical syndrome; Lumbar spondylosis; Lumbar facet syndrome (B) (R>L); History of migraine; Hyperlipidemia; GERD (gastroesophageal reflux disease); History of peptic ulcer disease; and History of anemia on her problem list. Her primarily concern today is the Back Pain (low and worse on the right)  Pain Assessment: Self-Reported Pain Score: 4 /10             Reported level is compatible with observation.       Pain Location: Back Pain Orientation: Lower, Left, Right Pain Descriptors / Indicators: Aching, Stabbing Pain Frequency: Constant  Shawna Carrillo was last seen on 09/01/2016 for medication management. During today's appointment we reviewed Shawna Carrillo's chronic pain status, as well as her outpatient medication regimen.  The patient  reports that she does not use drugs. Her body mass index is 28.98 kg/m.  Further details on both, my assessment(s), as well as the proposed treatment plan, please see below.  Controlled Substance  Pharmacotherapy Assessment REMS (Risk Evaluation and Mitigation Strategy)  Analgesic: Tramadol 100 mg every 6 hours (400 mg/day of tramadol) MME/day: 40 mg/day Angelique Holm, RN  09/21/2016 11:43 AM  Sign at close encounter Nursing Pain Medication Assessment:  Safety precautions to be maintained throughout the outpatient stay will include: orient to surroundings, keep bed in low position, maintain call bell within reach at all times, provide assistance with transfer out of bed and ambulation.  Medication Inspection Compliance: Shawna Carrillo did not comply with our request to bring her pills to be counted. She was reminded that bringing the medication bottles, even when empty, is a requirement. Pill Count: No pills available to be counted today. Bottle Appearance: No container available. Did not bring bottle(s) to appointment. Medication: None brought in. Filled Date: N/A Medication last intake: 09/21/2016 at 0700   Pharmacokinetics: Liberation and absorption (onset of action): WNL Distribution (time to peak effect): WNL Metabolism and excretion (duration of action): WNL         Pharmacodynamics: Desired effects: Analgesia: Shawna Carrillo reports >50% benefit. Functional ability: Patient reports that medication allows her to accomplish basic ADLs Clinically meaningful improvement in function (CMIF): Sustained CMIF goals met Perceived effectiveness: Described as relatively effective, allowing for increase in activities of daily living (ADL) Undesirable effects: Side-effects or Adverse reactions: None reported Monitoring: [place] PMP: Online review of the past 17-monthperiod conducted. Compliant with practice rules and regulations List of all UDS test(s) done:  Lab Results  Component Value Date   TOXASSSELUR FINAL 07/12/2015   Last UDS on record: ToxAssure Select 13  Date Value Ref Range Status  07/12/2015 FINAL  Final  Comment:     ==================================================================== TOXASSURE SELECT 13 (MW) ==================================================================== Test                             Result       Flag       Units Drug Present and Declared for Prescription Verification   Tramadol                       PRESENT      EXPECTED   O-Desmethyltramadol            PRESENT      EXPECTED   N-Desmethyltramadol            PRESENT      EXPECTED    Source of tramadol is a prescription medication.    O-desmethyltramadol and N-desmethyltramadol are expected    metabolites of tramadol. ==================================================================== Test                      Result    Flag   Units      Ref Range   Creatinine              49               mg/dL      >=20 ==================================================================== Declared Medications:  The flagging and interpretation on this report are based on the  following declared medications.  Unexpected results may arise from  inaccuracies in the declared medications.  **Note: The testing scope of this panel includes these medications:  Tramadol  **Note: The testing scope of this panel does not include following  reported medications:  Duloxetine (Cymbalta) ==================================================================== For clinical consultation, please call [phone]. ====================================================================    UDS interpretation: Compliant          Medication Assessment Form: Reviewed. Patient indicates being compliant with therapy Treatment compliance: Compliant Risk Assessment Profile: Aberrant behavior: See prior evaluations. None observed or detected today Comorbid factors increasing risk of overdose: See prior notes. No additional risks detected today Risk of substance use disorder (SUD): Low Opioid Risk Tool (ORT) Total Score: 1  Interpretation Table:  Score <3 = Low  Risk for SUD  Score between 4-7 = Moderate Risk for SUD  Score >8 = High Risk for Opioid Abuse   Risk Mitigation Strategies:  Patient Counseling: Covered Patient-Prescriber Agreement (PPA): Present and active  Notification to other healthcare providers: Done  Pharmacologic Plan: No change in therapy, at this time  Laboratory Chemistry  Inflammation Markers Lab Results  Component Value Date   ESRSEDRATE 11 12/29/2014   Renal Function Lab Results  Component Value Date   BUN 8 02/17/2015   CREATININE 0.71 02/17/2015   GFRAA 111 02/17/2015   GFRNONAA 96 02/17/2015   Hepatic Function Lab Results  Component Value Date   AST 24 02/17/2015   ALT 23 02/17/2015   ALBUMIN 4.6 02/17/2015   Electrolytes Lab Results  Component Value Date   NA 142 02/17/2015   K 4.5 02/17/2015   CL 103 02/17/2015   CALCIUM 10.2 02/17/2015   MG 1.9 06/19/2014   Pain Modulating Vitamins Lab Results  Component Value Date   VITAMINB12 243 02/17/2015   Coagulation Parameters Lab Results  Component Value Date   PLT 380 12/29/2014   Cardiovascular Lab Results  Component Value Date   HGB 13.6 12/29/2014   HCT 39.7 12/29/2014   Note: Lab results reviewed.  Recent Diagnostic Imaging Review  Dg Chest 2 View  Result Date: 12/29/2014 CLINICAL DATA:  Chronic cough.  Smoker.  High platelet count. EXAM: CHEST  2 VIEW COMPARISON:  None. FINDINGS: Normal sized heart. Clear lungs. Minimal diffuse peribronchial thickening. Mild thoracic spine degenerative changes. IMPRESSION: Minimal chronic bronchitic changes. Electronically Signed   By: Claudie Revering M.D.   On: 12/29/2014 16:32   Note: Imaging results reviewed.          Meds  The patient has a current medication list which includes the following prescription(s): duloxetine, ibuprofen, pramipexole, and tramadol.  Current Outpatient Prescriptions on File Prior to Visit  Medication Sig  . pramipexole (MIRAPEX) 0.125 MG tablet take 1-3 tablets by  mouth at bedtime if needed for restlessness legs   No current facility-administered medications on file prior to visit.    ROS  Constitutional: Denies any fever or chills Gastrointestinal: No reported hemesis, hematochezia, vomiting, or acute GI distress Musculoskeletal: Denies any acute onset joint swelling, redness, loss of ROM, or weakness Neurological: No reported episodes of acute onset apraxia, aphasia, dysarthria, agnosia, amnesia, paralysis, loss of coordination, or loss of consciousness  Allergies  Shawna Carrillo is allergic to latex and codeine.  PFSH  Drug: Shawna Carrillo  reports that she does not use drugs. Alcohol:  reports that she does not drink alcohol. Tobacco:  reports that she has quit smoking. Her smoking use included Cigarettes. She has a 20.00 pack-year smoking history. She has never used smokeless tobacco. Medical:  has a past medical history of B12 deficiency; History of anemia (07/12/2015); History of migraine (07/12/2015); History of peptic ulcer disease (07/12/2015); and Insomnia. Family: family history includes Diabetes in her mother; Heart disease in her father.  Past Surgical History:  Procedure Laterality Date  . hilar myectomy    . LUMBAR FUSION  2012   FAILED   Constitutional Exam  General appearance: Well nourished, well developed, and well hydrated. In no apparent acute distress Vitals:   09/21/16 1144  BP: 121/83  Pulse: 86  Resp: 16  Temp: 98.3 F (36.8 C)  TempSrc: Oral  SpO2: 99%  Weight: 185 lb (83.9 kg)  Height: _0  (1.702 m)   BMI Assessment: Estimated body mass index is 28.98 kg/m as calculated from the following:   Height as of this encounter: _1  (1.702 m).   Weight as of this encounter: 185 lb (83.9 kg).  BMI interpretation table: BMI level Category Range association with higher incidence of chronic pain  <18 kg/m2 Underweight   18.5-24.9 kg/m2 Ideal body weight   25-29.9 kg/m2 Overweight Increased incidence by 20%   30-34.9 kg/m2 Obese (Class I) Increased incidence by 68%  35-39.9 kg/m2 Severe obesity (Class II) Increased incidence by 136%  >40 kg/m2 Extreme obesity (Class III) Increased incidence by 254%   BMI Readings from Last 4 Encounters:  09/21/16 28.98 kg/m  01/27/16 29.76 kg/m  01/06/16 31.48 kg/m  07/12/15 30.67 kg/m   Wt Readings from Last 4 Encounters:  09/21/16 185 lb (83.9 kg)  01/27/16 190 lb (86.2 kg)  01/06/16 201 lb (91.2 kg)  07/12/15 190 lb (86.2 kg)  Psych/Mental status: Alert, oriented x 3 (person, place, & time) Eyes: PERLA Respiratory: No evidence of acute respiratory distress  Cervical Spine Exam  Inspection: No masses, redness, or swelling Alignment: Symmetrical Functional ROM: Unrestricted ROM Stability: No instability detected Muscle strength & Tone: Functionally intact Sensory: Unimpaired Palpation: Non-contributory  Upper Extremity (UE) Exam    Side: Right upper  extremity  Side: Left upper extremity  Inspection: No masses, redness, swelling, or asymmetry  Inspection: No masses, redness, swelling, or asymmetry  Functional ROM: Unrestricted ROM          Functional ROM: Unrestricted ROM          Muscle strength & Tone: Functionally intact  Muscle strength & Tone: Functionally intact  Sensory: Unimpaired  Sensory: Unimpaired  Palpation: Non-contributory  Palpation: Non-contributory   Thoracic Spine Exam  Inspection: No masses, redness, or swelling Alignment: Symmetrical Functional ROM: Unrestricted ROM Stability: No instability detected Sensory: Unimpaired Muscle strength & Tone: Functionally intact Palpation: Non-contributory  Lumbar Spine Exam  Inspection: No masses, redness, or swelling Alignment: Symmetrical Functional ROM: Unrestricted ROM Stability: No instability detected Muscle strength & Tone: Functionally intact Sensory: Unimpaired Palpation: Non-contributory Provocative Tests: Lumbar Hyperextension and rotation test: evaluation  deferred today       Patrick's Maneuver: evaluation deferred today              Gait & Posture Assessment  Ambulation: Unassisted Gait: Relatively normal for age and body habitus Posture: WNL   Lower Extremity Exam    Side: Right lower extremity  Side: Left lower extremity  Inspection: No masses, redness, swelling, or asymmetry  Inspection: No masses, redness, swelling, or asymmetry  Functional ROM: Unrestricted ROM          Functional ROM: Unrestricted ROM          Muscle strength & Tone: Functionally intact  Muscle strength & Tone: Functionally intact  Sensory: Unimpaired  Sensory: Unimpaired  Palpation: Non-contributory  Palpation: Non-contributory   Assessment  Primary Diagnosis & Pertinent Problem List: The primary encounter diagnosis was Failed back surgical syndrome. Diagnoses of Chronic pain syndrome, Lumbar spondylosis, Lumbar facet syndrome, and Chronic bilateral low back pain with bilateral sciatica were also pertinent to this visit.  Status Diagnosis  Stable Stable Stable 1. Failed back surgical syndrome   2. Chronic pain syndrome   3. Lumbar spondylosis   4. Lumbar facet syndrome   5. Chronic bilateral low back pain with bilateral sciatica      Plan of Care  Pharmacotherapy (Medications Ordered): Meds ordered this encounter  Medications  . traMADol (ULTRAM) 50 MG tablet    Sig: Take 2 tablets (100 mg total) by mouth every 6 (six) hours as needed.    Dispense:  240 tablet    Refill:  5    Do not place this medication, or any other prescription from our practice, on "Automatic Refill". Patient may have prescription filled one day early if pharmacy is closed on scheduled refill date.  . DULoxetine (CYMBALTA) 60 MG capsule    Sig: Take 1 capsule (60 mg total) by mouth daily.    Dispense:  180 capsule    Refill:  0    Do not place this medication, or any other prescription from our practice, on "Automatic Refill". Patient may have prescription filled one day early  if pharmacy is closed on scheduled refill date.   New Prescriptions   No medications on file   Medications administered today: Ms. Klinck had no medications administered during this visit. Lab-work, procedure(s), and/or referral(s): No orders of the defined types were placed in this encounter.  Imaging and/or referral(s): None  Interventional therapies: Planned, scheduled, and/or pending:   None at this time.    Considering:   Palliative treatments    Palliative PRN treatment(s):  Diagnostic bilateral lumbar facet block under fluoroscopic guidance and IV sedation, for  the low back pain. Diagnostic caudal epidural steroid injection + epidurogram under fluoroscopic guidance and IV sedation, for the lower extremity pain.    Provider-requested follow-up: Return in about 6 months (around 03/21/2017) for (NP) Med-Mgmt, in addition, (PRN) procedure.  No future appointments. Primary Care Physician: Lavera Guise, MD Location: [facility] Outpatient [facility] Management Facility Note by: Kathlen Brunswick. Dossie Arbour, M.D, DABA, DABAPM, DABPM, DABIPP, FIPP Date: 09/22/16; Time: 12:21 PM  Pain Score Disclaimer: We use the NRS-11 scale. This is a self-reported, subjective measurement of pain severity with only modest accuracy. It is used primarily to identify changes within a particular patient. It must be understood that outpatient pain scales are significantly less accurate that those used for research, where they can be applied under ideal controlled circumstances with minimal exposure to variables. In reality, the score is likely to be a combination of pain intensity and pain affect, where pain affect describes the degree of emotional arousal or changes in action readiness caused by the sensory experience of pain. Factors such as social and work situation, setting, emotional state, anxiety levels, expectation, and prior pain experience may influence pain perception and show large inter-individual differences  that may also be affected by time variables.  Patient instructions provided during this appointment: Patient Instructions  You were given prescriptions for Tramadol and Cymbalta today.

## 2016-09-21 NOTE — Patient Instructions (Signed)
You were given prescriptions for Tramadol and Cymbalta today.

## 2016-09-21 NOTE — Progress Notes (Signed)
Nursing Pain Medication Assessment:  Safety precautions to be maintained throughout the outpatient stay will include: orient to surroundings, keep bed in low position, maintain call bell within reach at all times, provide assistance with transfer out of bed and ambulation.  Medication Inspection Compliance: Shawna Carrillo did not comply with our request to bring her pills to be counted. She was reminded that bringing the medication bottles, even when empty, is a requirement. Pill Count: No pills available to be counted today. Bottle Appearance: No container available. Did not bring bottle(s) to appointment. Medication: None brought in. Filled Date: N/A Medication last intake: 09/21/2016 at 0700

## 2016-10-31 ENCOUNTER — Encounter: Payer: Self-pay | Admitting: Emergency Medicine

## 2016-10-31 ENCOUNTER — Emergency Department
Admission: EM | Admit: 2016-10-31 | Discharge: 2016-10-31 | Disposition: A | Discharge status: Home / Self Care | Payer: Self-pay | Attending: Emergency Medicine | Admitting: Emergency Medicine

## 2016-10-31 ENCOUNTER — Emergency Department: Payer: Self-pay

## 2016-10-31 DIAGNOSIS — Y999 Unspecified external cause status: Secondary | ICD-10-CM | POA: Insufficient documentation

## 2016-10-31 DIAGNOSIS — X501XXA Overexertion from prolonged static or awkward postures, initial encounter: Secondary | ICD-10-CM | POA: Insufficient documentation

## 2016-10-31 DIAGNOSIS — Z87891 Personal history of nicotine dependence: Secondary | ICD-10-CM | POA: Insufficient documentation

## 2016-10-31 DIAGNOSIS — Y9389 Activity, other specified: Secondary | ICD-10-CM | POA: Insufficient documentation

## 2016-10-31 DIAGNOSIS — Y929 Unspecified place or not applicable: Secondary | ICD-10-CM | POA: Insufficient documentation

## 2016-10-31 DIAGNOSIS — Z91040 Latex allergy status: Secondary | ICD-10-CM | POA: Insufficient documentation

## 2016-10-31 DIAGNOSIS — S43015A Anterior dislocation of left humerus, initial encounter: Secondary | ICD-10-CM | POA: Insufficient documentation

## 2016-10-31 DIAGNOSIS — Z791 Long term (current) use of non-steroidal anti-inflammatories (NSAID): Secondary | ICD-10-CM | POA: Insufficient documentation

## 2016-10-31 DIAGNOSIS — S43005A Unspecified dislocation of left shoulder joint, initial encounter: Secondary | ICD-10-CM

## 2016-10-31 MED ORDER — SODIUM CHLORIDE 0.9 % IV BOLUS (SEPSIS)
1000.0000 mL | Freq: Once | INTRAVENOUS | Status: AC
  Administered 2016-10-31: 1000 mL via INTRAVENOUS

## 2016-10-31 MED ORDER — ONDANSETRON HCL 4 MG/2ML IJ SOLN
4.0000 mg | Freq: Once | INTRAMUSCULAR | Status: AC
  Administered 2016-10-31: 4 mg via INTRAVENOUS

## 2016-10-31 MED ORDER — ONDANSETRON HCL 4 MG/2ML IJ SOLN
INTRAMUSCULAR | Status: AC
  Administered 2016-10-31: 4 mg via INTRAVENOUS
  Filled 2016-10-31: qty 2

## 2016-10-31 MED ORDER — MORPHINE SULFATE (PF) 4 MG/ML IV SOLN
INTRAVENOUS | Status: AC
  Administered 2016-10-31: 4 mg via INTRAVENOUS
  Filled 2016-10-31: qty 1

## 2016-10-31 MED ORDER — ETOMIDATE 2 MG/ML IV SOLN
0.1000 mg/kg | Freq: Once | INTRAVENOUS | Status: AC
Start: 2016-10-31 — End: 2016-10-31
  Administered 2016-10-31: 8.16 mg via INTRAVENOUS
  Filled 2016-10-31: qty 10

## 2016-10-31 MED ORDER — MORPHINE SULFATE (PF) 4 MG/ML IV SOLN
4.0000 mg | Freq: Once | INTRAVENOUS | Status: AC
  Administered 2016-10-31: 4 mg via INTRAVENOUS

## 2016-10-31 NOTE — ED Triage Notes (Signed)
Patient ambulatory to triage with steady gait, without difficulty or distress noted; pt reports possible dislocated left shoulder with hx of same x 1 45yr ago; st unsure of occurrence, but believes she slept on it wrong

## 2016-10-31 NOTE — Discharge Instructions (Signed)
Please follow up with orthopedic surgery.

## 2016-10-31 NOTE — ED Provider Notes (Signed)
[facility] Emergency Department Provider Note   ____________________________________________   First MD Initiated Contact with Patient 10/31/16 0522     (approximate)  I have reviewed the triage vital signs and the nursing notes.   HISTORY  Chief Complaint Shoulder Pain    HPI Shawna Carrillo is a 56 y.o. female who comes into the hospital today with a dislocated shoulder. The patient reports that she was sleeping when it occurred. She takes her arm was above her head and she went to rollover and then she developed man pain. The patient reports that she has dislocated this shoulder in the past. She reports that it was last done in [place] and she does not have a orthopedic surgeon who she has seen for her shoulder here. The patient rates her pain a 10 out of 10 in intensity. She did not take anything for pain she came in for evaluation. The patient reports that [facility] has been to [facility] in [facility] past.   Past Medical History:  Diagnosis Date  . B12 deficiency   . History of anemia 07/12/2015  . History of migraine 07/12/2015  . History of peptic ulcer disease 07/12/2015  . Insomnia     Patient Active Problem List   Diagnosis Date Noted  . Chronic pain 07/12/2015  . Long term current use of opiate analgesic 07/12/2015  . Long term prescription opiate use 07/12/2015  . Opiate use (40 MME/Day) 07/12/2015  . Encounter for therapeutic drug level monitoring 07/12/2015  . Chronic leg pain (R) (L5 Big toe) 07/12/2015  . 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 Lumbar spondylosis 07/12/2015  . Lumbar facet syndrome (B) (R>L) 07/12/2015  . History of migraine 07/12/2015  . Hyperlipidemia 07/12/2015  . GERD (gastroesophageal reflux disease) 07/12/2015  . History of peptic ulcer disease 07/12/2015  . History of anemia 07/12/2015  . 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 Nocturnal leg cramps  02/17/2015  . Chronic insomnia 02/17/2015  . Elevated platelet count ([place]) 02/17/2015  . Achalasia 02/17/2015    Past Surgical History:  Procedure Laterality Date  . hilar myectomy    . LUMBAR FUSION  2012   FAILED    Prior to Admission medications   Medication Sig Start Date End Date Taking? Authorizing Provider  DULoxetine (CYMBALTA) 60 MG capsule Take 1 capsule (60 mg total) by mouth daily. 09/21/16 03/20/17  Milinda Pointer, MD  ibuprofen (ADVIL,MOTRIN) 200 MG tablet Take 800 mg by mouth as needed for mild pain.    Historical Provider, MD  pramipexole (MIRAPEX) 0.125 MG tablet take 1-3 tablets by mouth at bedtime if needed for restlessness legs 10/27/15   Historical Provider, MD  traMADol (ULTRAM) 50 MG tablet Take 2 tablets (100 mg total) by mouth every 6 (six) hours as needed. 09/21/16 03/20/17  Milinda Pointer, MD    Allergies Latex and Codeine  Family History  Problem Relation Age of Onset  . Diabetes Mother   . Heart disease Father     Social History Social History  Substance Use Topics  . Smoking status: Former Smoker    Packs/day: 1.00    Years: 20.00    Types: Cigarettes  . Smokeless tobacco: Never Used  . Alcohol use No    Review of Systems Constitutional: No fever/chills Eyes: No visual changes. ENT: No sore throat. Cardiovascular: Denies chest pain. Respiratory: Denies shortness of breath. Gastrointestinal: No abdominal pain.  No nausea, no vomiting.  No diarrhea.  No constipation. Genitourinary: Negative for dysuria. Musculoskeletal: left shoulder pain Skin: Negative for rash. Neurological: Negative for headaches, focal weakness or numbness.  10-point ROS otherwise negative.  ____________________________________________   PHYSICAL EXAM:  VITAL SIGNS: ED Triage Vitals  Enc Vitals Group     BP 10/31/16 0500 114/77     Pulse Rate 10/31/16 0500 75     Resp 10/31/16 0500 18     Temp 10/31/16 0500 97.6 F (36.4 C)     Temp Source 10/31/16  0500 Oral     SpO2 10/31/16 0500 98 %     Weight 10/31/16 0500 180 lb (81.6 kg)     Height 10/31/16 0500 '5\' 7"'$  (1.702 m)     Head Circumference --      Peak Flow --      Pain Score 10/31/16 0459 9     Pain Loc --      Pain Edu? --      Excl. in [place]? --     Constitutional: Alert and oriented. Well appearing and in moderate distress. Eyes: Conjunctivae are normal. PERRL. EOMI. Head: Atraumatic. Nose: No congestion/rhinnorhea. Mouth/Throat: Mucous membranes are moist.  Oropharynx non-erythematous. Cardiovascular: Normal rate, regular rhythm. Grossly normal heart sounds.  Good peripheral circulation. Respiratory: Normal respiratory effort.  No retractions. Lungs CTAB. Gastrointestinal: Soft and nontender. No distention.  Musculoskeletal: Left shoulder deformity with some pain and inability to move left arm.   Neurologic:  Normal speech and language.  Skin:  Skin is warm, dry and intact.  Psychiatric: Mood and affect are normal.   ____________________________________________   LABS (all labs ordered are listed, but only abnormal results are displayed)  Labs Reviewed - No data to display ____________________________________________  EKG  none ____________________________________________  RADIOLOGY  Xray left shoulder ____________________________________________   PROCEDURES  Procedure(s) performed: please, see procedure note(s).  Reduction of dislocation Date/Time: 10/31/2016 5:52 AM Performed by: Loney Hering Authorized by: Loney Hering  Consent: Verbal consent obtained. Consent given by: patient Patient understanding: patient states understanding of the procedure being performed Imaging studies: imaging studies available Patient identity confirmed: verbally with patient  Sedation: Patient sedated: yes Sedation type: moderate (conscious) sedation Sedatives: etomidate Analgesia: morphine Sedation start date/time: 10/31/2016 5:55 AM Sedation end  date/time: 10/31/2016 5:57 AM Vitals: Vital signs were monitored during sedation. Patient tolerance: Patient tolerated the procedure well with no immediate complications     Critical Care performed: No  ____________________________________________   INITIAL IMPRESSION / ASSESSMENT AND PLAN / ED COURSE  Pertinent labs & imaging results that were available during my care of the patient were reviewed by me and considered in my medical decision making (see chart for details).  This is a 56 year old female who comes into the hospital today with a left shoulder dislocation. The patient has dislocated her shoulder in the past. I did give the patient a dose of morphine initially. I then decided to reduce the patient's shoulder dislocation with sedation.  Clinical Course as of Oct 31 714  Tue Oct 31, 2016  0712 Anterior left glenohumeral dislocation. DG Shoulder Left [AW]  0713 Reduction of left glenohumeral dislocation, now in anatomic alignment.   DG Shoulder Left Portable [AW]    Clinical Course User Index [AW] Loney Hering, MD   I was able to reduce the patient's dislocation without a lot of difficulty. The patient received approximately 12 mg of etomidate to achieve appropriate moderate I placed the patient in a shoulder immobilizer and we  repeated the x-ray which was reduced. The patient reports after she awoke that she was feeling well and her pain was improved. She will be discharged home to follow-up with orthopedic surgery.  ____________________________________________   FINAL CLINICAL IMPRESSION(S) / ED DIAGNOSES  Final diagnoses:  Dislocation of left shoulder joint, initial encounter      NEW MEDICATIONS STARTED DURING THIS VISIT:  Discharge Medication List as of 10/31/2016  6:52 AM       Note:  This document was prepared using Dragon voice recognition software and may include unintentional dictation errors.    Loney Hering, MD 10/31/16 [phone]

## 2016-10-31 NOTE — Sedation Documentation (Signed)
Dahlia Client MD, and Gilford Rile, RN present at bedside.

## 2016-10-31 NOTE — Sedation Documentation (Signed)
XR at bedside

## 2016-11-01 ENCOUNTER — Emergency Department
Admission: EM | Admit: 2016-11-01 | Discharge: 2016-11-01 | Disposition: A | Discharge status: Home / Self Care | Payer: Self-pay | Attending: Student in an Organized Health Care Education/Training Program | Admitting: Student in an Organized Health Care Education/Training Program

## 2016-11-01 DIAGNOSIS — Z91040 Latex allergy status: Secondary | ICD-10-CM | POA: Insufficient documentation

## 2016-11-01 DIAGNOSIS — Z79899 Other long term (current) drug therapy: Secondary | ICD-10-CM | POA: Insufficient documentation

## 2016-11-01 DIAGNOSIS — L237 Allergic contact dermatitis due to plants, except food: Secondary | ICD-10-CM | POA: Insufficient documentation

## 2016-11-01 DIAGNOSIS — Z87891 Personal history of nicotine dependence: Secondary | ICD-10-CM | POA: Insufficient documentation

## 2016-11-01 MED ORDER — METHYLPREDNISOLONE SODIUM SUCC 125 MG IJ SOLR
125.0000 mg | Freq: Once | INTRAMUSCULAR | Status: AC
  Administered 2016-11-01: 125 mg via INTRAMUSCULAR
  Filled 2016-11-01: qty 2

## 2016-11-01 MED ORDER — METHYLPREDNISOLONE 4 MG PO TBPK: ORAL_TABLET | ORAL | 0 refills | Status: DC

## 2016-11-01 MED ORDER — HYDROXYZINE HCL 50 MG PO TABS
50.0000 mg | ORAL_TABLET | Freq: Once | ORAL | Status: AC
  Administered 2016-11-01: 50 mg via ORAL
  Filled 2016-11-01: qty 1

## 2016-11-01 MED ORDER — HYDROXYZINE HCL 50 MG PO TABS: 50.0000 mg | ORAL_TABLET | Freq: Three times a day (TID) | ORAL | 0 refills | Status: DC | PRN

## 2016-11-01 NOTE — ED Triage Notes (Signed)
Pt reports got into some poison Ivy last Thursday and then Saturday woke up and had it around her eyes. Pt with redness and swelling around eyes. States also has it on her arms.

## 2016-11-01 NOTE — ED Provider Notes (Signed)
[facility] Emergency Department Provider Note   ____________________________________________   First MD Initiated Contact with Patient 11/01/16 0840     (approximate)  I have reviewed the triage vital signs and the nursing notes.   HISTORY  Chief Complaint Rash and Poison Ivy    HPI Shawna Carrillo is a 56 y.o. female patient complaining a rash secondary to poison ivy. Patient states 5 days ago she was pulling some brush and knows a rash on arms. Patient state 3 days ago she noticed facial rash. Patient state there is redness and swelling under her eyes. Also states rash is spreading up her arms. Patient stated no relief with over-the-counter and leftover steroid medication.Patient denies pain, just itching.   Past Medical History:  Diagnosis Date  . B12 deficiency   . History of anemia 07/12/2015  . History of migraine 07/12/2015  . History of peptic ulcer disease 07/12/2015  . Insomnia     Patient Active Problem List   Diagnosis Date Noted  . Chronic pain 07/12/2015  . Long term current use of opiate analgesic 07/12/2015  . Long term prescription opiate use 07/12/2015  . Opiate use (40 MME/Day) 07/12/2015  . Encounter for therapeutic drug level monitoring 07/12/2015  . Chronic leg pain (R) (L5 Big toe) 07/12/2015  . 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 Lumbar spondylosis 07/12/2015  . Lumbar facet syndrome (B) (R>L) 07/12/2015  . History of migraine 07/12/2015  . Hyperlipidemia 07/12/2015  . GERD (gastroesophageal reflux disease) 07/12/2015  . History of peptic ulcer disease 07/12/2015  . History of anemia 07/12/2015  . 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 Nocturnal leg cramps 02/17/2015  . Chronic insomnia 02/17/2015  . Elevated platelet count ([place]) 02/17/2015  . Achalasia 02/17/2015    Past Surgical History:  Procedure Laterality Date  . hilar myectomy    . LUMBAR FUSION   2012   FAILED    Prior to Admission medications   Medication Sig Start Date End Date Taking? Authorizing Provider  DULoxetine (CYMBALTA) 60 MG capsule Take 1 capsule (60 mg total) by mouth daily. 09/21/16 03/20/17  Milinda Pointer, MD  hydrOXYzine (ATARAX/VISTARIL) 50 MG tablet Take 1 tablet (50 mg total) by mouth 3 (three) times daily as needed. 11/01/16   Sable Feil, PA-C  ibuprofen (ADVIL,MOTRIN) 200 MG tablet Take 800 mg by mouth as needed for mild pain.    Historical Provider, MD  methylPREDNISolone (MEDROL DOSEPAK) 4 MG TBPK tablet Take Tapered dose as directed 11/01/16   Sable Feil, PA-C  pramipexole (MIRAPEX) 0.125 MG tablet take 1-3 tablets by mouth at bedtime if needed for restlessness legs 10/27/15   Historical Provider, MD  traMADol (ULTRAM) 50 MG tablet Take 2 tablets (100 mg total) by mouth every 6 (six) hours as needed. 09/21/16 03/20/17  Milinda Pointer, MD    Allergies Latex and Codeine  Family History  Problem Relation Age of Onset  . Diabetes Mother   . Heart disease Father     Social History Social History  Substance Use Topics  . Smoking status: Former Smoker    Packs/day: 1.00    Years: 20.00    Types: Cigarettes  . Smokeless tobacco: Never Used  . Alcohol use No    Review of Systems Constitutional: No fever/chills Eyes: No visual changes. ENT: No sore throat. Cardiovascular: Denies chest pain. Respiratory: Denies shortness of breath. Gastrointestinal: No abdominal pain.  No nausea, no vomiting.  No diarrhea.  No constipation. Genitourinary: Negative for dysuria. Musculoskeletal: Negative for back pain. Skin: Positive for rash. Neurological: Negative for headaches, focal weakness or numbness.    ____________________________________________   PHYSICAL EXAM:  VITAL SIGNS: ED Triage Vitals  Enc Vitals Group     BP 11/01/16 0825 (!) 153/93     Pulse Rate 11/01/16 0825 (!) 103     Resp 11/01/16 0825 20     Temp 11/01/16 0825 98.8 F  (37.1 C)     Temp Source 11/01/16 0825 Oral     SpO2 11/01/16 0825 96 %     Weight 11/01/16 0826 185 lb (83.9 kg)     Height 11/01/16 0826 '5\' 7"'$  (1.702 m)     Head Circumference --      Peak Flow --      Pain Score --      Pain Loc --      Pain Edu? --      Excl. in [place]? --     Constitutional: Alert and oriented. Well appearing and in no acute distress. Eyes: Conjunctivae are normal. PERRL. EOMI. Head: Atraumatic. Nose: No congestion/rhinnorhea. Mouth/Throat: Mucous membranes are moist.  Oropharynx non-erythematous. Neck: No stridor.  No cervical spine tenderness to palpation. Hematological/Lymphatic/Immunilogical: No cervical lymphadenopathy. Cardiovascular: Normal rate, regular rhythm. Grossly normal heart sounds.  Good peripheral circulation. Respiratory: Normal respiratory effort.  No retractions. Lungs CTAB. Gastrointestinal: Soft and nontender. No distention. No abdominal bruits. No CVA tenderness. Musculoskeletal: No lower extremity tenderness nor edema.  No joint effusions. Neurologic:  Normal speech and language. No gross focal neurologic deficits are appreciated. No gait instability. Skin:  Skin is warm, dry and intact. Vesicle lesions on erythematous base with mild edema bilateral inferior orbital area. The lesion bilateral forearms. No signs symptoms secondary infection. Psychiatric: Mood and affect are normal. Speech and behavior are normal.  ____________________________________________   LABS (all labs ordered are listed, but only abnormal results are displayed)  Labs Reviewed - No data to display ____________________________________________  EKG   ____________________________________________  RADIOLOGY   ____________________________________________   PROCEDURES  Procedure(s) performed: None  Procedures  Critical Care performed: No  ____________________________________________   INITIAL IMPRESSION / ASSESSMENT AND PLAN / ED COURSE  Pertinent  labs & imaging results that were available during my care of the patient were reviewed by me and considered in my medical decision making (see chart for details).  Contact dermatitis suspect poison [place]. Patient given discharge Instructions. Patient is given Solu Medrol in the clinic and Atarax. Patient discharged with the [facility] dosepak and Atarax. Patient advised to follow-up family doctor this condition persists.    ____________________________________________   FINAL CLINICAL IMPRESSION(S) / ED DIAGNOSES  Final diagnoses:  Allergic contact dermatitis due to plants, except food      NEW MEDICATIONS STARTED DURING THIS VISIT:  New Prescriptions   HYDROXYZINE (ATARAX/VISTARIL) 50 MG TABLET    Take 1 tablet (50 mg total) by mouth 3 (three) times daily as needed.   METHYLPREDNISOLONE (MEDROL DOSEPAK) 4 MG TBPK TABLET    Take Tapered dose as directed     Note:  This document was prepared using Dragon voice recognition software and may include unintentional dictation errors.    Sable Feil, PA-C 11/01/16 0865    Merlyn Lot, MD 11/01/16 1556

## 2016-11-01 NOTE — ED Notes (Signed)
See triage note  Was exposed to poison ivy last weekend   Redness and swelling noted around eyes and rash in ears and on neck. States she had some prednisone at home and is on last dose this am

## 2017-03-21 ENCOUNTER — Encounter: Payer: Self-pay | Admitting: Nurse Practitioner

## 2017-03-21 ENCOUNTER — Ambulatory Visit: Payer: Worker's Compensation | Attending: Nurse Practitioner | Admitting: Nurse Practitioner

## 2017-03-21 VITALS — BP 136/89 | HR 87 | Temp 98.4°F | Resp 18 | Ht 67.0 in | Wt 175.0 lb

## 2017-03-21 DIAGNOSIS — Z91040 Latex allergy status: Secondary | ICD-10-CM | POA: Diagnosis not present

## 2017-03-21 DIAGNOSIS — M545 Low back pain: Secondary | ICD-10-CM | POA: Diagnosis present

## 2017-03-21 DIAGNOSIS — D649 Anemia, unspecified: Secondary | ICD-10-CM | POA: Insufficient documentation

## 2017-03-21 DIAGNOSIS — E538 Deficiency of other specified B group vitamins: Secondary | ICD-10-CM | POA: Diagnosis not present

## 2017-03-21 DIAGNOSIS — Z8249 Family history of ischemic heart disease and other diseases of the circulatory system: Secondary | ICD-10-CM | POA: Diagnosis not present

## 2017-03-21 DIAGNOSIS — G4700 Insomnia, unspecified: Secondary | ICD-10-CM | POA: Insufficient documentation

## 2017-03-21 DIAGNOSIS — K219 Gastro-esophageal reflux disease without esophagitis: Secondary | ICD-10-CM | POA: Insufficient documentation

## 2017-03-21 DIAGNOSIS — M79606 Pain in leg, unspecified: Secondary | ICD-10-CM | POA: Diagnosis not present

## 2017-03-21 DIAGNOSIS — E785 Hyperlipidemia, unspecified: Secondary | ICD-10-CM | POA: Diagnosis not present

## 2017-03-21 DIAGNOSIS — Z87891 Personal history of nicotine dependence: Secondary | ICD-10-CM | POA: Insufficient documentation

## 2017-03-21 DIAGNOSIS — M47816 Spondylosis without myelopathy or radiculopathy, lumbar region: Secondary | ICD-10-CM | POA: Diagnosis not present

## 2017-03-21 DIAGNOSIS — Z79891 Long term (current) use of opiate analgesic: Secondary | ICD-10-CM | POA: Insufficient documentation

## 2017-03-21 DIAGNOSIS — Z885 Allergy status to narcotic agent status: Secondary | ICD-10-CM | POA: Diagnosis not present

## 2017-03-21 DIAGNOSIS — M5441 Lumbago with sciatica, right side: Secondary | ICD-10-CM | POA: Diagnosis not present

## 2017-03-21 DIAGNOSIS — K220 Achalasia of cardia: Secondary | ICD-10-CM | POA: Diagnosis not present

## 2017-03-21 DIAGNOSIS — G894 Chronic pain syndrome: Secondary | ICD-10-CM | POA: Insufficient documentation

## 2017-03-21 DIAGNOSIS — Z833 Family history of diabetes mellitus: Secondary | ICD-10-CM | POA: Insufficient documentation

## 2017-03-21 DIAGNOSIS — Z8711 Personal history of peptic ulcer disease: Secondary | ICD-10-CM | POA: Diagnosis not present

## 2017-03-21 DIAGNOSIS — Z981 Arthrodesis status: Secondary | ICD-10-CM | POA: Insufficient documentation

## 2017-03-21 DIAGNOSIS — M5442 Lumbago with sciatica, left side: Secondary | ICD-10-CM | POA: Insufficient documentation

## 2017-03-21 DIAGNOSIS — M961 Postlaminectomy syndrome, not elsewhere classified: Secondary | ICD-10-CM

## 2017-03-21 DIAGNOSIS — G8929 Other chronic pain: Secondary | ICD-10-CM

## 2017-03-21 MED ORDER — TRAMADOL HCL 50 MG PO TABS: 100.0000 mg | ORAL_TABLET | Freq: Four times a day (QID) | ORAL | 5 refills | Status: DC | PRN

## 2017-03-21 MED ORDER — DULOXETINE HCL 60 MG PO CPEP: 60.0000 mg | ORAL_CAPSULE | Freq: Every day | ORAL | 0 refills | Status: DC

## 2017-03-21 NOTE — Patient Instructions (Signed)

## 2017-03-21 NOTE — Progress Notes (Signed)
Patient's Name: Shawna Carrillo  MRN: [id number]  Referring Provider: Lavera Guise, MD  DOB: 08-Nov-1960  PCP: Lavera Guise, MD  DOS: 03/21/2017  Note by: Vevelyn Francois NP  Service setting: Ambulatory outpatient  Specialty: Interventional Pain Management  Location: [facility] (AMB) Pain Management Facility    Patient type: Established    Primary Reason(s) for Visit: Encounter for prescription drug management. (Level of risk: moderate)  CC: Back Pain (lower)  HPI  Ms. Hopman is a 56 y.o. year old, female patient, who comes today for a medication management evaluation. 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 Her primarily concern today is the Back Pain (lower)  Pain Assessment: Location: Lower Back Radiating: entire right leg, down to the big toe, numbness and tingling in right lower leg and top of the foot Onset: More than a month ago Duration: Chronic pain Quality: Shooting Severity: 4 /10 (self-reported pain score)  Note: Reported level is inconsistent with clinical observations. Clinically the patient looks like a 2/10       Effect on ADL:   Timing: Constant Modifying factors: changing positions  Ms. Portell was last scheduled for an appointment on 09/21/2016 for medication management. During today's appointment we reviewed Ms. Marker's chronic pain status, as well as her outpatient medication regimen. She does have weakness in her lower  extremities. She denies any recent falls or injuries. She declines any interventional procedures at this time secondary to scar tissue.  The patient  reports that she does not use drugs. Her body mass index is 27.41 kg/m.  Further details on both, my assessment(s), as well as the proposed treatment plan, please see below.  Controlled Substance Pharmacotherapy Assessment REMS (Risk Evaluation and Mitigation Strategy)  Analgesic:Tramadol 100 mg every 6 hours (400 mg/day of tramadol) MME/day:40 mg/day  Landis Martins, RN  03/21/2017 10:55 AM  Sign at close encounter Nursing Pain Medication Assessment:  Safety precautions to be maintained throughout the outpatient stay will include: orient to surroundings, keep bed in low position, maintain call bell within reach at all times, provide assistance with transfer out of bed and ambulation.  Medication Inspection Compliance: Pill count conducted under aseptic conditions, in front of the patient. Neither the pills nor the bottle was removed from the patient's sight at any time. Once count was completed pills were immediately returned to the patient in their original bottle.  Medication: Tramadol (Ultram) Pill/Patch Count: 72 of 240 pills remain Pill/Patch Appearance: Markings consistent with prescribed medication Bottle Appearance: Standard pharmacy container. Clearly labeled. Filled Date: 05/22 / 2018 Last Medication intake:  Today   Pharmacokinetics: Liberation and absorption (onset of action): WNL Distribution (time to peak effect): WNL Metabolism and excretion (duration of action): WNL         Pharmacodynamics: Desired effects: Analgesia: Ms. Tilmon reports >50% benefit. Functional ability: Patient reports that medication allows her to accomplish basic ADLs Clinically meaningful improvement in function (CMIF): Sustained CMIF goals met Perceived effectiveness: Described as relatively effective, allowing for increase in activities of  daily living (ADL)  Undesirable effects: Side-effects or Adverse reactions: None reported Monitoring: [place] PMP: Online review of the past 25-monthperiod conducted. Compliant with practice rules and regulations List of all UDS test(s) done:  Lab Results  Component Value Date   TOXASSSELUR FINAL 07/12/2015   Last UDS on record: ToxAssure Select 13  Date Value Ref Range Status  07/12/2015 FINAL  Final    Comment:    ==================================================================== TOXASSURE SELECT 13 (MW) ==================================================================== Test                             Result       Flag       Units Drug Present and Declared for Prescription Verification   Tramadol                       PRESENT      EXPECTED   O-Desmethyltramadol            PRESENT      EXPECTED   N-Desmethyltramadol            PRESENT      EXPECTED    Source of tramadol is a prescription medication.    O-desmethyltramadol and N-desmethyltramadol are expected    metabolites of tramadol. ==================================================================== Test                      Result    Flag   Units      Ref Range   Creatinine              49               mg/dL      >=20 ==================================================================== Declared Medications:  The flagging and interpretation on this report are based on the  following declared medications.  Unexpected results may arise from  inaccuracies in the declared medications.  **Note: The testing scope of this panel includes these medications:  Tramadol  **Note: The testing scope of this panel does not include following  reported medications:  Duloxetine (Cymbalta) ==================================================================== For clinical consultation, please call [phone] ====================================================================    UDS interpretation: Compliant          Medication  Assessment Form: Reviewed. Patient indicates being compliant with therapy Treatment compliance: Compliant Risk Assessment Profile: Aberrant behavior: See prior evaluations. None observed or detected today Comorbid factors increasing risk of overdose: See prior notes. No additional risks detected today Risk of substance use disorder (SUD): Low Opioid Risk Tool (ORT) Total Score: 0  Interpretation Table:  Score <3 = Low Risk for SUD  Score between 4-7 = Moderate Risk for SUD  Score >8 = High Risk for Opioid Abuse   Risk Mitigation Strategies:  Patient Counseling: Covered Patient-Prescriber Agreement (PPA): Present and active  Notification to other healthcare providers: Done  Pharmacologic Plan: No change in therapy, at this time  Laboratory Chemistry  Inflammation Markers (CRP: Acute Phase) (ESR: Chronic Phase) Lab Results  Component Value Date   ESRSEDRATE 11 12/29/2014                 Renal Function Markers Lab Results  Component Value Date   BUN 8 02/17/2015   CREATININE 0.71 02/17/2015   GFRAA 111 02/17/2015   GFRNONAA 96 02/17/2015                 Hepatic Function Markers Lab Results  Component Value Date   AST 24  02/17/2015   ALT 23 02/17/2015   ALBUMIN 4.6 02/17/2015   ALKPHOS 63 02/17/2015                 Electrolytes Lab Results  Component Value Date   NA 142 02/17/2015   K 4.5 02/17/2015   CL 103 02/17/2015   CALCIUM 10.2 02/17/2015   MG 1.9 06/19/2014                 Neuropathy Markers Lab Results  Component Value Date   VITAMINB12 243 02/17/2015                 Bone Pathology Markers Lab Results  Component Value Date   ALKPHOS 63 02/17/2015   CALCIUM 10.2 02/17/2015                 Coagulation Parameters Lab Results  Component Value Date   PLT 380 12/29/2014                 Cardiovascular Markers Lab Results  Component Value Date   HGB 13.6 12/29/2014   HCT 39.7 12/29/2014                 Note: Lab results reviewed.  Recent  Diagnostic Imaging Review  No results found. Note: Imaging results reviewed.          Meds   Current Meds  Medication Sig  . ibuprofen (ADVIL,MOTRIN) 200 MG tablet Take 800 mg by mouth as needed for mild pain.  . pramipexole (MIRAPEX) 0.125 MG tablet take 1-3 tablets by mouth at bedtime if needed for restlessness legs    ROS  Constitutional: Denies any fever or chills Gastrointestinal: No reported hemesis, hematochezia, vomiting, or acute GI distress Musculoskeletal: Denies any acute onset joint swelling, redness, loss of ROM, or weakness Neurological: No reported episodes of acute onset apraxia, aphasia, dysarthria, agnosia, amnesia, paralysis, loss of coordination, or loss of consciousness  Allergies  Ms. Coltrain is allergic to latex and codeine.  PFSH  Drug: Ms. Carbine  reports that she does not use drugs. Alcohol:  reports that she does not drink alcohol. Tobacco:  reports that she has quit smoking. Her smoking use included Cigarettes. She has a 20.00 pack-year smoking history. She has never used smokeless tobacco. Medical:  has a past medical history of B12 deficiency; History of anemia (07/12/2015); History of migraine (07/12/2015); History of peptic ulcer disease (07/12/2015); and Insomnia. Surgical: Ms. Athens  has a past surgical history that includes Lumbar fusion (2012) and hilar myectomy. Family: family history includes Diabetes in her mother; Heart disease in her father.  Constitutional Exam  General appearance: Well nourished, well developed, and well hydrated. In no apparent acute distress Vitals:   03/21/17 1049  BP: 136/89  Pulse: 87  Resp: 18  Temp: 98.4 F (36.9 C)  TempSrc: Oral  SpO2: 96%  Weight: 175 lb (79.4 kg)  Height: 5' 7" (1.702 m)   BMI Assessment: Estimated body mass index is 27.41 kg/m as calculated from the following:   Height as of this encounter: 5' 7" (1.702 m).   Weight as of this encounter: 175 lb (79.4 kg).  BMI  interpretation table: BMI level Category Range association with higher incidence of chronic pain  <18 kg/m2 Underweight   18.5-24.9 kg/m2 Ideal body weight   25-29.9 kg/m2 Overweight Increased incidence by 20%  30-34.9 kg/m2 Obese (Class I) Increased incidence by 68%  35-39.9 kg/m2 Severe obesity (Class II) Increased incidence by 136%  >  40 kg/m2 Extreme obesity (Class III) Increased incidence by 254%   BMI Readings from Last 4 Encounters:  03/21/17 27.41 kg/m  11/01/16 28.98 kg/m  10/31/16 28.19 kg/m  09/21/16 28.98 kg/m   Wt Readings from Last 4 Encounters:  03/21/17 175 lb (79.4 kg)  11/01/16 185 lb (83.9 kg)  10/31/16 180 lb (81.6 kg)  09/21/16 185 lb (83.9 kg)  Psych/Mental status: Alert, oriented x 3 (person, place, & time)       Eyes: PERLA Respiratory: No evidence of acute respiratory distress  Cervical Spine Exam  Inspection: No masses, redness, or swelling Alignment: Symmetrical Functional ROM: Unrestricted ROM      Stability: No instability detected Muscle strength & Tone: Functionally intact Sensory: Unimpaired Palpation: No palpable anomalies              Upper Extremity (UE) Exam    Side: Right upper extremity  Side: Left upper extremity  Inspection: No masses, redness, swelling, or asymmetry. No contractures  Inspection: No masses, redness, swelling, or asymmetry. No contractures  Functional ROM: Unrestricted ROM          Functional ROM: Unrestricted ROM          Muscle strength & Tone: Functionally intact  Muscle strength & Tone: Functionally intact  Sensory: Unimpaired  Sensory: Unimpaired  Palpation: No palpable anomalies              Palpation: No palpable anomalies              Specialized Test(s): Deferred         Specialized Test(s): Deferred          Thoracic Spine Exam  Inspection: No masses, redness, or swelling Alignment: Symmetrical Functional ROM: Unrestricted ROM Stability: No instability detected Sensory: Unimpaired Muscle strength &  Tone: No palpable anomalies  Lumbar Spine Exam  Inspection: Well healed scar from previous spine surgery detected Alignment: Symmetrical Functional ROM: Unrestricted ROM      Stability: No instability detected Muscle strength & Tone: Functionally intact Sensory: Unimpaired Palpation: Complains of area being tender to palpation       Provocative Tests: Lumbar Hyperextension and rotation test: evaluation deferred today       Patrick's Maneuver: evaluation deferred today                    Gait & Posture Assessment  Ambulation: Unassisted Gait: Relatively normal for age and body habitus Posture: WNL   Lower Extremity Exam    Side: Right lower extremity  Side: Left lower extremity  Inspection: No masses, redness, swelling, or asymmetry. No contractures  Inspection: No masses, redness, swelling, or asymmetry. 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 Diagnoses of Lumbar spondylosis, Chronic pain of lower extremity, unspecified laterality, Failed back surgical syndrome, Chronic pain syndrome, and Long term current use of opiate analgesic were also pertinent to this visit.  Status Diagnosis  Controlled Controlled Controlled 1. 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 Lumbar spondylosis   3. Chronic  pain of lower extremity, unspecified laterality   4. Failed back surgical syndrome   5. Chronic pain syndrome   6. 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 Currently managed by Dr. Dossie Arbour   Nocturnal Leg Cramps  Long Term Current Use of Opiate Analgesic  Long Term Prescription Opiate Use  Opiate use (40 MME/Day)  Encounter for Therapeutic Drug Level Monitoring  History of Migraine  Hyperlipidemia  Gerd (Gastroesophageal Reflux Disease)  History of Peptic Ulcer Disease  History of Anemia  Chronic Insomnia  Elevated Platelet Count  Achalasia   Plan of Care  Pharmacotherapy (Medications Ordered): Meds ordered this encounter  Medications  . traMADol (ULTRAM) 50 MG tablet    Sig: Take 2 tablets (100 mg total) by mouth every 6 (six) hours as needed.    Dispense:  240 tablet    Refill:  5    Do not place this medication, or any other prescription from our practice, on "Automatic Refill". Patient may have prescription filled one day early if pharmacy is closed on scheduled refill date.    Order Specific Question:   Supervising Provider    Answer:   Milinda Pointer [phone]  . DULoxetine (CYMBALTA) 60 MG capsule    Sig: Take 1 capsule (60 mg total) by mouth daily.    Dispense:  180 capsule    Refill:  0    Do not place this medication, or any other prescription from our practice, on "Automatic Refill". Patient may have prescription filled one day early if pharmacy is closed on scheduled refill date.    Order Specific Question:   Supervising Provider    Answer:   Milinda Pointer [[id number]]   New Prescriptions   No medications on file   Medications administered today: Ms. Storie had no medications administered during this visit. Lab-work, procedure(s), and/or referral(s): Orders Placed This Encounter  Procedures  . ToxASSURE Select 13 (MW), Urine   Imaging and/or referral(s): None  Interventional  therapies: Planned, scheduled, and/or pending:   None at this time.    Considering:   Diagnostic bilateral lumbar facet block  Diagnostic caudal epidural steroid injection + epidurogram for the lower extremity pain   Palliative PRN treatment(s):  Palliative bilateral lumbar facet block  Palliative caudal epidural steroid injection + epidurogram for the lower extremity pain.      Provider-requested follow-up: Return in about 6 months (around 09/21/2017) for MedMgmt.  Future Appointments Date Time Provider [place]  10/16/2017 10:45 AM Vevelyn Francois, NP [facility] None   Primary Care Physician: Lavera Guise, MD Location: [facility] Note by: Vevelyn Francois NP Date: 03/21/2017; Time: 12:06 PM  Pain Score Disclaimer: We use the NRS-11 scale. This is a self-reported, subjective measurement of pain severity with only modest accuracy. It is used primarily to identify changes within a particular patient. It must be understood that outpatient pain scales are significantly less accurate that those used for research, where they can be applied under ideal controlled circumstances with minimal exposure to variables. In reality, the score is likely to be a combination of pain intensity and pain affect, where pain affect describes the degree of emotional arousal  or changes in action readiness caused by the sensory experience of pain. Factors such as social and work situation, setting, emotional state, anxiety levels, expectation, and prior pain experience may influence pain perception and show large inter-individual differences that may also be affected by time variables.  Patient instructions provided during this appointment: Patient Instructions   ____________________________________________________________________________________________  Medication Rules  Applies to: All patients receiving prescriptions (written or electronic).  Pharmacy of record:  Pharmacy where electronic prescriptions will be sent. If written prescriptions are taken to a different pharmacy, please inform the nursing staff. The pharmacy listed in the electronic medical record should be the one where you would like electronic prescriptions to be sent.  Prescription refills: Only during scheduled appointments. Applies to both, written and electronic prescriptions.  NOTE: The following applies primarily to controlled substances (Opioid* Pain Medications).   Patient's responsibilities: 1. Pain Pills: Bring all pain pills to every appointment (except for procedure appointments). 2. Pill Bottles: Bring pills in original pharmacy bottle. Always bring newest bottle. Bring bottle, even if empty. 3. Medication refills: You are responsible for knowing and keeping track of what medications you need refilled. The day before your appointment, write a list of all prescriptions that need to be refilled. Bring that list to your appointment and give it to the admitting nurse. Prescriptions will be written only during appointments. If you forget a medication, it will not be "Called in", "Faxed", or "electronically sent". You will need to get another appointment to get these prescribed. 4. Prescription Accuracy: You are responsible for carefully inspecting your prescriptions before leaving our office. Have the discharge nurse carefully go over each prescription with you, before taking them home. Make sure that your name is accurately spelled, that your address is correct. Check the name and dose of your medication to make sure it is accurate. Check the number of pills, and the written instructions to make sure they are clear and accurate. Make sure that you are given enough medication to last until your next medication refill appointment. 5. Taking Medication: Take medication as prescribed. Never take more pills than instructed. Never take medication more frequently than prescribed. Taking less pills  or less frequently is permitted and encouraged, when it comes to controlled substances (written prescriptions).  6. Inform other Doctors: Always inform, all of your healthcare providers, of all the medications you take. 7. Pain Medication from other Providers: You are not allowed to accept any additional pain medication from any other Doctor or Healthcare provider. There are two exceptions to this rule. (see below) In the event that you require additional pain medication, you are responsible for notifying us, as stated below. 8. Medication Agreement: You are responsible for carefully reading and following our Medication Agreement. This must be signed before receiving any prescriptions from our practice. Safely store a copy of your signed Agreement. Violations to the Agreement will result in no further prescriptions. (Additional copies of our Medication Agreement are available upon request.) 9. Laws, Rules, & Regulations: All patients are expected to follow all Federal and [employer], Rules, [employer]. Ignorance of the Laws does not constitute a valid excuse. The use of any illegal substances is prohibited. 10. Adopted CDC guidelines & recommendations: Target dosing levels will be at or below 60 MME/day. Use of benzodiazepines** is not recommended.  Exceptions: There are only two exceptions to the rule of not receiving pain medications from other Healthcare Providers. 1. Exception #1 (Emergencies): In the event of an emergency (i.e.: accident  requiring emergency care), you are allowed to receive additional pain medication. However, you are responsible for: As soon as you are able, call our office [phone], at any time of the day or night, and leave a message stating your name, the date and nature of the emergency, and the name and dose of the medication prescribed. In the event that your call is answered by a member of our staff, make sure to document and save the date, time, and the name  of the person that took your information.  2. Exception #2 (Planned Surgery): In the event that you are scheduled by another doctor or dentist to have any type of surgery or procedure, you are allowed (for a period no longer than 30 days), to receive additional pain medication, for the acute post-op pain. However, in this case, you are responsible for picking up a copy of our "Post-op Pain Management for Surgeons" handout, and giving it to your surgeon or dentist. This document is available at our office, and does not require an appointment to obtain it. Simply go to our office during business hours (Monday-Thursday from 8:00 AM to 4:00 PM) (Friday 8:00 AM to 12:00 Noon) or if you have a scheduled appointment with [place], prior to your surgery, and ask for it by name. In addition, you will need to provide [place] with your name, name of your surgeon, type of surgery, and date of procedure or surgery.  *Opioid medications include: morphine, codeine, oxycodone, oxymorphone, hydrocodone, hydromorphone, meperidine, tramadol, tapentadol, buprenorphine, fentanyl, methadone. **Benzodiazepine medications include: diazepam (Valium), alprazolam (Xanax), clonazepam (Klonopine), lorazepam (Ativan), clorazepate (Tranxene), chlordiazepoxide (Librium), estazolam (Prosom), oxazepam (Serax), temazepam (Restoril), triazolam (Halcion)  ____________________________________________________________________________________________

## 2017-03-21 NOTE — Progress Notes (Signed)
Nursing Pain Medication Assessment:  Safety precautions to be maintained throughout the outpatient stay will include: orient to surroundings, keep bed in low position, maintain call bell within reach at all times, provide assistance with transfer out of bed and ambulation.  Medication Inspection Compliance: Pill count conducted under aseptic conditions, in front of the patient. Neither the pills nor the bottle was removed from the patient's sight at any time. Once count was completed pills were immediately returned to the patient in their original bottle.  Medication: Tramadol (Ultram) Pill/Patch Count: 72 of 240 pills remain Pill/Patch Appearance: Markings consistent with prescribed medication Bottle Appearance: Standard pharmacy container. Clearly labeled. Filled Date: 05/22 / 2018 Last Medication intake:  Today

## 2017-03-27 LAB — TOXASSURE SELECT 13 (MW), URINE

## 2017-04-27 ENCOUNTER — Other Ambulatory Visit: Payer: Self-pay | Admitting: Pain Medicine

## 2017-04-27 DIAGNOSIS — G894 Chronic pain syndrome: Secondary | ICD-10-CM

## 2017-09-12 ENCOUNTER — Telehealth: Payer: Self-pay

## 2017-09-13 ENCOUNTER — Other Ambulatory Visit: Payer: Self-pay

## 2017-09-13 DIAGNOSIS — G894 Chronic pain syndrome: Secondary | ICD-10-CM

## 2017-09-13 MED ORDER — DULOXETINE HCL 60 MG PO CPEP: 60.0000 mg | ORAL_CAPSULE | Freq: Every day | ORAL | 0 refills | Status: DC

## 2017-09-20 NOTE — Telephone Encounter (Signed)
error 

## 2017-09-24 ENCOUNTER — Encounter: Payer: Self-pay | Admitting: Nurse Practitioner

## 2017-09-24 ENCOUNTER — Ambulatory Visit: Payer: Worker's Compensation | Attending: Nurse Practitioner | Admitting: Nurse Practitioner

## 2017-09-24 ENCOUNTER — Other Ambulatory Visit: Payer: Self-pay

## 2017-09-24 VITALS — BP 142/91 | HR 88 | Temp 98.2°F | Resp 16 | Ht 67.0 in | Wt 180.0 lb

## 2017-09-24 DIAGNOSIS — G894 Chronic pain syndrome: Secondary | ICD-10-CM | POA: Diagnosis not present

## 2017-09-24 DIAGNOSIS — Z91040 Latex allergy status: Secondary | ICD-10-CM | POA: Insufficient documentation

## 2017-09-24 DIAGNOSIS — M79604 Pain in right leg: Secondary | ICD-10-CM

## 2017-09-24 DIAGNOSIS — K219 Gastro-esophageal reflux disease without esophagitis: Secondary | ICD-10-CM | POA: Insufficient documentation

## 2017-09-24 DIAGNOSIS — G8929 Other chronic pain: Secondary | ICD-10-CM

## 2017-09-24 DIAGNOSIS — M5442 Lumbago with sciatica, left side: Secondary | ICD-10-CM

## 2017-09-24 DIAGNOSIS — E785 Hyperlipidemia, unspecified: Secondary | ICD-10-CM | POA: Insufficient documentation

## 2017-09-24 DIAGNOSIS — M47816 Spondylosis without myelopathy or radiculopathy, lumbar region: Secondary | ICD-10-CM | POA: Diagnosis not present

## 2017-09-24 DIAGNOSIS — F5104 Psychophysiologic insomnia: Secondary | ICD-10-CM | POA: Insufficient documentation

## 2017-09-24 DIAGNOSIS — M961 Postlaminectomy syndrome, not elsewhere classified: Secondary | ICD-10-CM

## 2017-09-24 DIAGNOSIS — Z87891 Personal history of nicotine dependence: Secondary | ICD-10-CM | POA: Insufficient documentation

## 2017-09-24 DIAGNOSIS — Z5181 Encounter for therapeutic drug level monitoring: Secondary | ICD-10-CM | POA: Insufficient documentation

## 2017-09-24 DIAGNOSIS — Z885 Allergy status to narcotic agent status: Secondary | ICD-10-CM | POA: Diagnosis not present

## 2017-09-24 DIAGNOSIS — D649 Anemia, unspecified: Secondary | ICD-10-CM | POA: Diagnosis not present

## 2017-09-24 DIAGNOSIS — M47896 Other spondylosis, lumbar region: Secondary | ICD-10-CM | POA: Diagnosis not present

## 2017-09-24 DIAGNOSIS — M5441 Lumbago with sciatica, right side: Secondary | ICD-10-CM | POA: Diagnosis not present

## 2017-09-24 MED ORDER — TRAMADOL HCL 50 MG PO TABS: 100.0000 mg | ORAL_TABLET | Freq: Four times a day (QID) | ORAL | 5 refills | Status: DC | PRN

## 2017-09-24 MED ORDER — DULOXETINE HCL 60 MG PO CPEP: 60.0000 mg | ORAL_CAPSULE | Freq: Every day | ORAL | 0 refills | Status: DC

## 2017-09-24 NOTE — Patient Instructions (Addendum)
____________________________________________________________________________________________  Pain Scale  Introduction: The pain score used by this practice is the Verbal Numerical Rating Scale (VNRS-11). This is an 11-point scale. It is for adults and children 10 years or older. There are significant differences in how the pain score is reported, used, and applied. Forget everything you learned in the past and learn this scoring system.  General Information: The scale should reflect your current level of pain. Unless you are specifically asked for the level of your worst pain, or your average pain. If you are asked for one of these two, then it should be understood that it is over the past 24 hours.  Basic Activities of Daily Living (ADL): Personal hygiene, dressing, eating, transferring, and using restroom.  Instructions: Most patients tend to report their level of pain as a combination of two factors, their physical pain and their psychosocial pain. This last one is also known as "suffering" and it is reflection of how physical pain affects you socially and psychologically. From now on, report them separately. From this point on, when asked to report your pain level, report only your physical pain. Use the following table for reference.  Pain Clinic Pain Levels (0-5/10)  Pain Level Score  Description  No Pain 0   Mild pain 1 Nagging, annoying, but does not interfere with basic activities of daily living (ADL). Patients are able to eat, bathe, get dressed, toileting (being able to get on and off the toilet and perform personal hygiene functions), transfer (move in and out of bed or a chair without assistance), and maintain continence (able to control bladder and bowel functions). Blood pressure and heart rate are unaffected. A normal heart rate for a healthy adult ranges from 60 to 100 bpm (beats per minute).   Mild to moderate pain 2 Noticeable and distracting. Impossible to hide from other  people. More frequent flare-ups. Still possible to adapt and function close to normal. It can be very annoying and may have occasional stronger flare-ups. With discipline, patients may get used to it and adapt.   Moderate pain 3 Interferes significantly with activities of daily living (ADL). It becomes difficult to feed, bathe, get dressed, get on and off the toilet or to perform personal hygiene functions. Difficult to get in and out of bed or a chair without assistance. Very distracting. With effort, it can be ignored when deeply involved in activities.   Moderately severe pain 4 Impossible to ignore for more than a few minutes. With effort, patients may still be able to manage work or participate in some social activities. Very difficult to concentrate. Signs of autonomic nervous system discharge are evident: dilated pupils (mydriasis); mild sweating (diaphoresis); sleep interference. Heart rate becomes elevated (>115 bpm). Diastolic blood pressure (lower number) rises above 100 mmHg. Patients find relief in laying down and not moving.   Severe pain 5 Intense and extremely unpleasant. Associated with frowning face and frequent crying. Pain overwhelms the senses.  Ability to do any activity or maintain social relationships becomes significantly limited. Conversation becomes difficult. Pacing back and forth is common, as getting into a comfortable position is nearly impossible. Pain wakes you up from deep sleep. Physical signs will be obvious: pupillary dilation; increased sweating; goosebumps; brisk reflexes; cold, clammy hands and feet; nausea, vomiting or dry heaves; loss of appetite; significant sleep disturbance with inability to fall asleep or to remain asleep. When persistent, significant weight loss is observed due to the complete loss of appetite and sleep deprivation.  Blood   pressure and heart rate becomes significantly elevated. Caution: If elevated blood pressure triggers a pounding headache  associated with blurred vision, then the patient should immediately seek attention at an urgent or emergency care unit, as these may be signs of an impending stroke.    Emergency Department Pain Levels (6-10/10)  Emergency Room Pain 6 Severely limiting. Requires emergency care and should not be seen or managed at an outpatient pain management facility. Communication becomes difficult and requires great effort. Assistance to reach the emergency department may be required. Facial flushing and profuse sweating along with potentially dangerous increases in heart rate and blood pressure will be evident.   Distressing pain 7 Self-care is very difficult. Assistance is required to transport, or use restroom. Assistance to reach the emergency department will be required. Tasks requiring coordination, such as bathing and getting dressed become very difficult.   Disabling pain 8 Self-care is no longer possible. At this level, pain is disabling. The individual is unable to do even the most "basic" activities such as walking, eating, bathing, dressing, transferring to a bed, or toileting. Fine motor skills are lost. It is difficult to think clearly.   Incapacitating pain 9 Pain becomes incapacitating. Thought processing is no longer possible. Difficult to remember your own name. Control of movement and coordination are lost.   The worst pain imaginable 10 At this level, most patients pass out from pain. When this level is reached, collapse of the autonomic nervous system occurs, leading to a sudden drop in blood pressure and heart rate. This in turn results in a temporary and dramatic drop in blood flow to the brain, leading to a loss of consciousness. Fainting is one of the body's self defense mechanisms. Passing out puts the brain in a calmed state and causes it to shut down for a while, in order to begin the healing process.    Summary: 1. Refer to this scale when providing [place] with your pain level. 2. Be  accurate and careful when reporting your pain level. This will help with your care. 3. Over-reporting your pain level will lead to loss of credibility. 4. Even a level of 1/10 means that there is pain and will be treated at our facility. 5. High, inaccurate reporting will be documented as "Symptom Exaggeration", leading to loss of credibility and suspicions of possible secondary gains such as obtaining more narcotics, or wanting to appear disabled, for fraudulent reasons. 6. Only pain levels of 5 or below will be seen at our facility. 7. Pain levels of 6 and above will be sent to the Emergency Department and the appointment cancelled. ____________________________________________________________________________________________    ____________________________________________________________________________________________  Medication Rules  Applies to: All patients receiving prescriptions (written or electronic).  Pharmacy of record: Pharmacy where electronic prescriptions will be sent. If written prescriptions are taken to a different pharmacy, please inform the nursing staff. The pharmacy listed in the electronic medical record should be the one where you would like electronic prescriptions to be sent.  Prescription refills: Only during scheduled appointments. Applies to both, written and electronic prescriptions.  NOTE: The following applies primarily to controlled substances (Opioid* Pain Medications).   Patient's responsibilities: 1. Pain Pills: Bring all pain pills to every appointment (except for procedure appointments). 2. Pill Bottles: Bring pills in original pharmacy bottle. Always bring newest bottle. Bring bottle, even if empty. 3. Medication refills: You are responsible for knowing and keeping track of what medications you need refilled. The day before your appointment, write a list of  all prescriptions that need to be refilled. Bring that list to your appointment and give it to  the admitting nurse. Prescriptions will be written only during appointments. If you forget a medication, it will not be "Called in", "Faxed", or "electronically sent". You will need to get another appointment to get these prescribed. 4. Prescription Accuracy: You are responsible for carefully inspecting your prescriptions before leaving our office. Have the discharge nurse carefully go over each prescription with you, before taking them home. Make sure that your name is accurately spelled, that your address is correct. Check the name and dose of your medication to make sure it is accurate. Check the number of pills, and the written instructions to make sure they are clear and accurate. Make sure that you are given enough medication to last until your next medication refill appointment. 5. Taking Medication: Take medication as prescribed. Never take more pills than instructed. Never take medication more frequently than prescribed. Taking less pills or less frequently is permitted and encouraged, when it comes to controlled substances (written prescriptions).  6. Inform other Doctors: Always inform, all of your healthcare providers, of all the medications you take. 7. Pain Medication from other Providers: You are not allowed to accept any additional pain medication from any other Doctor or Healthcare provider. There are two exceptions to this rule. (see below) In the event that you require additional pain medication, you are responsible for notifying us, as stated below. 8. Medication Agreement: You are responsible for carefully reading and following our Medication Agreement. This must be signed before receiving any prescriptions from our practice. Safely store a copy of your signed Agreement. Violations to the Agreement will result in no further prescriptions. (Additional copies of our Medication Agreement are available upon request.) 9. Laws, Rules, & Regulations: All patients are expected to follow all  Federal and [employer], Rules, [employer]. Ignorance of the Laws does not constitute a valid excuse. The use of any illegal substances is prohibited. 10. Adopted CDC guidelines & recommendations: Target dosing levels will be at or below 60 MME/day. Use of benzodiazepines** is not recommended.  Exceptions: There are only two exceptions to the rule of not receiving pain medications from other Healthcare Providers. 1. Exception #1 (Emergencies): In the event of an emergency (i.e.: accident requiring emergency care), you are allowed to receive additional pain medication. However, you are responsible for: As soon as you are able, call our office [phone], at any time of the day or night, and leave a message stating your name, the date and nature of the emergency, and the name and dose of the medication prescribed. In the event that your call is answered by a member of our staff, make sure to document and save the date, time, and the name of the person that took your information.  2. Exception #2 (Planned Surgery): In the event that you are scheduled by another doctor or dentist to have any type of surgery or procedure, you are allowed (for a period no longer than 30 days), to receive additional pain medication, for the acute post-op pain. However, in this case, you are responsible for picking up a copy of our "Post-op Pain Management for Surgeons" handout, and giving it to your surgeon or dentist. This document is available at our office, and does not require an appointment to obtain it. Simply go to our office during business hours (Monday-Thursday from 8:00 AM to 4:00 PM) (Friday 8:00 AM to 12:00 Noon) or if  you have a scheduled appointment with [place], prior to your surgery, and ask for it by name. In addition, you will need to provide [place] with your name, name of your surgeon, type of surgery, and date of procedure or surgery.  *Opioid medications include: morphine, codeine, oxycodone,  oxymorphone, hydrocodone, hydromorphone, meperidine, tramadol, tapentadol, buprenorphine, fentanyl, methadone. **Benzodiazepine medications include: diazepam (Valium), alprazolam (Xanax), clonazepam (Klonopine), lorazepam (Ativan), clorazepate (Tranxene), chlordiazepoxide (Librium), estazolam (Prosom), oxazepam (Serax), temazepam (Restoril), triazolam (Halcion)  ____________________________________________________________________________________________   BMI Assessment: Estimated body mass index is 28.19 kg/m as calculated from the following:   Height as of this encounter: 5\' 7"  (1.702 m).   Weight as of this encounter: 180 lb (81.6 kg).

## 2017-09-24 NOTE — Progress Notes (Signed)
Patient's Name: Shawna Carrillo  MRN: [id number]  Referring Provider: Lavera Guise, MD  DOB: 06-25-1961  PCP: Lavera Guise, MD  DOS: 09/24/2017  Note by: Vevelyn Francois NP  Service setting: Ambulatory outpatient  Specialty: Interventional Pain Management  Location: [facility] (AMB) Pain Management Facility    Patient type: Established    Primary Reason(s) for Visit: Encounter for prescription drug management. (Level of risk: moderate)  CC: Back Pain (low)  HPI  Shawna Carrillo is a 57 y.o. year old, female patient, who comes today for a medication management evaluation. 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 Her primarily concern today is the Back Pain (low)  Pain Assessment: Location: Lower, Right, Left, Mid Back Radiating: radiates up to mid back Onset: More than a month ago Duration: Chronic pain Quality: Constant, Aching, Stabbing, Tingling Severity: 5 /10 (self-reported pain score)  Note: Reported level is compatible with observation.                          Effect on ADL: unable to lift more than 10 pounds, difficult to bend over over, difficult to remain in 1 position for very long Timing: Constant Modifying factors: changing positions, medications  Shawna Carrillo was last scheduled for an appointment on Visit date not found for medication management. During today's appointment we reviewed Shawna Carrillo's chronic pain status, as well as her  outpatient medication regimen. She has right leg numbness, tingling with weakness that goes into the bottom of her foot. She has constant pain. She admits that she has had some hardware failure in the past. She is concern with how this is looking. She is SP MRI 2015, completed at [place]. She denies any bowel or bladder dysfunction. She continues to do well on the Tramadol and Cymbalta. She denies any side effects.  The patient  reports that she does not use drugs. Her body mass index is 28.19 kg/m.  Further details on both, my assessment(s), as well as the proposed treatment plan, please see below.  Controlled Substance Pharmacotherapy Assessment REMS (Risk Evaluation and Mitigation Strategy)  Analgesic:Tramadol 100 mg every 6 hours (400 mg/day of tramadol) MME/day:40 mg/day  Shawna Carrillo  09/24/2017  9:18 AM  Sign at close encounter Nursing Pain Medication Assessment:  Safety precautions to be maintained throughout the outpatient stay will include: orient to surroundings, keep bed in low position, maintain call bell within reach at all times, provide assistance with transfer out of bed and ambulation.  Medication Inspection Compliance: Pill count conducted under aseptic conditions, in front of the patient. Neither the pills nor the bottle was removed from the patient's sight at any time. Once count was completed pills were immediately returned to the patient in their original bottle.  Medication: Tramadol (Ultram) Pill/Patch Count: 127 of 240 pills remain Pill/Patch Appearance: Markings consistent with prescribed medication Bottle Appearance: Standard pharmacy container. Clearly labeled. Filled Date: 71 / 20 / 2018 Last Medication intake:  Today   Pharmacokinetics: Liberation and absorption (onset of  action): WNL Distribution (time to peak effect): WNL Metabolism and excretion (duration of action): WNL         Pharmacodynamics: Desired effects: Analgesia: Shawna Carrillo reports  >50% benefit. Functional ability: Patient reports that medication allows her to accomplish basic ADLs Clinically meaningful improvement in function (CMIF): Sustained CMIF goals met Perceived effectiveness: Described as relatively effective, allowing for increase in activities of daily living (ADL) Undesirable effects: Side-effects or Adverse reactions: None reported Monitoring: [place] PMP: Online review of the past 55-monthperiod conducted. Compliant with practice rules and regulations Last UDS on record: Summary  Date Value Ref Range Status  03/21/2017 FINAL  Final    Comment:    ==================================================================== TOXASSURE SELECT 13 (MW) ==================================================================== Test                             Result       Flag       Units Drug Present and Declared for Prescription Verification   Tramadol                       PRESENT      EXPECTED   O-Desmethyltramadol            PRESENT      EXPECTED   N-Desmethyltramadol            PRESENT      EXPECTED    Source of tramadol is a prescription medication.    O-desmethyltramadol and N-desmethyltramadol are expected    metabolites of tramadol. ==================================================================== Test                      Result    Flag   Units      Ref Range   Creatinine              45               mg/dL      >=20 ==================================================================== Declared Medications:  The flagging and interpretation on this report are based on the  following declared medications.  Unexpected results may arise from  inaccuracies in the declared medications.  **Note: The testing scope of this panel includes these medications:  Tramadol  **Note: The testing scope of this panel does not include following  reported medications:  Duloxetine (Cymbalta)  Ibuprofen   Pramipexole ==================================================================== For clinical consultation, please call ([phone] ====================================================================    UDS interpretation: Compliant          Medication Assessment Form: Reviewed. Patient indicates being compliant with therapy Treatment compliance: Compliant Risk Assessment Profile: Aberrant behavior: See prior evaluations. None observed or detected today Comorbid factors increasing risk of overdose: See prior notes. No additional risks detected today Risk of substance use disorder (SUD): Low Opioid Risk Tool - 09/24/17 0909      Family History of Substance Abuse   Alcohol  Negative    Illegal Drugs  Negative    Rx Drugs  Negative      Personal History of Substance Abuse   Alcohol  Negative    Illegal Drugs  Negative    Rx Drugs  Negative      Age   Age between 112-45years   No      History of Preadolescent Sexual Abuse   History of Preadolescent Sexual Abuse  Negative or Female      Psychological Disease   Psychological Disease  Negative  Depression  Negative      Total Score   Opioid Risk Tool Scoring  0    Opioid Risk Interpretation  Low Risk      ORT Scoring interpretation table:  Score <3 = Low Risk for SUD  Score between 4-7 = Moderate Risk for SUD  Score >8 = High Risk for Opioid Abuse   Risk Mitigation Strategies:  Patient Counseling: Covered Patient-Prescriber Agreement (PPA): Present and active  Notification to other healthcare providers: Done  Pharmacologic Plan: No change in therapy, at this time.             Laboratory Chemistry  Inflammation Markers (CRP: Acute Phase) (ESR: Chronic Phase) Lab Results  Component Value Date   ESRSEDRATE 11 12/29/2014                 Rheumatology Markers No results found for: Elayne Guerin, [facility]              Renal Function Markers Lab Results  Component Value Date   BUN 8  02/17/2015   CREATININE 0.71 02/17/2015   GFRAA 111 02/17/2015   GFRNONAA 96 02/17/2015                 Hepatic Function Markers Lab Results  Component Value Date   AST 24 02/17/2015   ALT 23 02/17/2015   ALBUMIN 4.6 02/17/2015   ALKPHOS 63 02/17/2015                 Electrolytes Lab Results  Component Value Date   NA 142 02/17/2015   K 4.5 02/17/2015   CL 103 02/17/2015   CALCIUM 10.2 02/17/2015   MG 1.9 06/19/2014                 Neuropathy Markers Lab Results  Component Value Date   VITAMINB12 243 02/17/2015                 Bone Pathology Markers No results found for: [place], [id number], [id number], [id number], 25OHVITD1, 25OHVITD2, 25OHVITD3, TESTOFREE, TESTOSTERONE               Coagulation Parameters Lab Results  Component Value Date   PLT 380 12/29/2014                 Cardiovascular Markers Lab Results  Component Value Date   HGB 13.6 12/29/2014   HCT 39.7 12/29/2014                 CA Markers No results found for: CEA, CA125, LABCA2               Note: Lab results reviewed.  Recent Diagnostic Imaging Results   Complexity Note: Imaging results reviewed. Results shared with Shawna Carrillo, using Layman's terms.                         Meds   Current Outpatient Medications:  .  [START ON 09/29/2017] DULoxetine (CYMBALTA) 60 MG capsule, Take 1 capsule (60 mg total) by mouth daily., Disp: 180 capsule, Rfl: 0 .  ibuprofen (ADVIL,MOTRIN) 200 MG tablet, Take 800 mg by mouth as needed for mild pain., Disp: , Rfl:  .  pramipexole (MIRAPEX) 0.125 MG tablet, take 1-3 tablets by mouth at bedtime if needed for restlessness legs, Disp: , Rfl: 0 .  [START ON 09/29/2017] traMADol (ULTRAM) 50 MG tablet, Take 2 tablets (100 mg total) by mouth every 6 (six) hours as needed.,  Disp: 240 tablet, Rfl: 5  ROS  Constitutional: Denies any fever or chills Gastrointestinal: No reported hemesis, hematochezia, vomiting, or acute GI distress Musculoskeletal: Denies any acute  onset joint swelling, redness, loss of ROM, or weakness Neurological: No reported episodes of acute onset apraxia, aphasia, dysarthria, agnosia, amnesia, paralysis, loss of coordination, or loss of consciousness  Allergies  Shawna Carrillo is allergic to latex and codeine.  PFSH  Drug: Shawna Carrillo  reports that she does not use drugs. Alcohol:  reports that she does not drink alcohol. Tobacco:  reports that she has quit smoking. Her smoking use included cigarettes. She has a 20.00 pack-year smoking history. she has never used smokeless tobacco. Medical:  has a past medical history of B12 deficiency, History of anemia (07/12/2015), History of migraine (07/12/2015), History of peptic ulcer disease (07/12/2015), and Insomnia. Surgical: Shawna Carrillo  has a past surgical history that includes Lumbar fusion (2012) and hilar myectomy. Family: family history includes Diabetes in her mother; Heart disease in her father.  Constitutional Exam  General appearance: Well nourished, well developed, and well hydrated. In no apparent acute distress Vitals:   09/24/17 0900  BP: (!) 142/91  Pulse: 88  Resp: 16  Temp: 98.2 F (36.8 C)  TempSrc: Oral  SpO2: 99%  Weight: 180 lb (81.6 kg)  Height: '5\' 7"'  (1.702 m)  Psych/Mental status: Alert, oriented x 3 (person, place, & time)       Eyes: PERLA Respiratory: No evidence of acute respiratory distress  Thoracic Spine Area Exam  Skin & Axial Inspection: No masses, redness, or swelling Alignment: Symmetrical Functional ROM: Unrestricted ROM Stability: No instability detected Muscle Tone/Strength: Functionally intact. No obvious neuro-muscular anomalies detected. Sensory (Neurological): Unimpaired Muscle strength & Tone: No palpable anomalies  Lumbar Spine Area Exam  Skin & Axial Inspection: Well healed scar from previous spine surgery detected Alignment: Symmetrical Functional ROM: Unrestricted ROM      Stability: No instability detected Muscle  Tone/Strength: Functionally intact. No obvious neuro-muscular anomalies detected. Sensory (Neurological): Unimpaired Palpation: No palpable anomalies       Provocative Tests: Lumbar Hyperextension and rotation test: evaluation deferred today       Lumbar Lateral bending test: evaluation deferred today       Patrick's Maneuver: evaluation deferred today                    Gait & Posture Assessment  Ambulation: Unassisted Gait: Relatively normal for age and body habitus Posture: WNL   Lower Extremity Exam    Side: Right lower extremity  Side: Left lower extremity  Skin & Extremity Inspection: Skin color, temperature, and hair growth are WNL. No peripheral edema or cyanosis. No masses, redness, swelling, asymmetry, or associated skin lesions. No contractures.  Skin & Extremity Inspection: Skin color, temperature, and hair growth are WNL. No peripheral edema or cyanosis. No masses, redness, swelling, asymmetry, or associated skin lesions. No contractures.  Functional ROM: Unrestricted ROM          Functional ROM: Unrestricted ROM          Muscle Tone/Strength: Functionally intact. No obvious neuro-muscular anomalies detected.  Muscle Tone/Strength: Functionally intact. No obvious neuro-muscular anomalies detected.  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 Diagnoses of Chronic pain of right lower extremity,  Failed back surgical syndrome, Lumbar facet syndrome (B) (R>L), Lumbar spondylosis, and Chronic pain syndrome were also pertinent to this visit.  Status Diagnosis  Controlled Controlled Controlled 1. 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 Chronic pain of right lower extremity   3. Failed back  surgical syndrome   4. Lumbar facet syndrome (B) (R>L)   5. Lumbar spondylosis   6. Chronic pain syndrome     Problems updated and reviewed during this visit: No problems updated. Plan of Care  Pharmacotherapy (Medications Ordered): Meds ordered this encounter  Medications  . traMADol (ULTRAM) 50 MG tablet    Sig: Take 2 tablets (100 mg total) by mouth every 6 (six) hours as needed.    Dispense:  240 tablet    Refill:  5    Do not place this medication, or any other prescription from our practice, on "Automatic Refill". Patient may have prescription filled one day early if pharmacy is closed on scheduled refill date. May fill on:09/29/2017 To last Until: 03/28/2018    Order Specific Question:   Supervising Provider    Answer:   Milinda Pointer [phone]  . DULoxetine (CYMBALTA) 60 MG capsule    Sig: Take 1 capsule (60 mg total) by mouth daily.    Dispense:  180 capsule    Refill:  0    Do not place this medication, or any other prescription from our practice, on "Automatic Refill". Patient may have prescription filled one day early if pharmacy is closed on scheduled refill date.    Order Specific Question:   Supervising Provider    Answer:   Milinda Pointer [[id number]]   New Prescriptions   No medications on file   Medications administered today: Marissia Hoying had no medications administered during this visit. Lab-work, procedure(s), and/or referral(s): Orders Placed This Encounter  Procedures  . MR LUMBAR SPINE WO CONTRAST   Imaging and/or referral(s): MR LUMBAR SPINE WO CONTRAST  Interventional therapies: Planned, scheduled, and/or pending: None at this time.    Considering: Diagnostic bilateral lumbar facet block  Diagnostic caudal epidural steroid injection + epidurogram for the lower extremity pain   Palliative PRN treatment(s): Palliative bilateral lumbar facet block  Palliative caudal epidural steroid injection + epidurogram for the lower extremity  pain.    Provider-requested follow-up: Return in about 6 months (around 03/24/2018) for MedMgmt with Me Donella Stade [facility]).  Future Appointments  Date Time Provider [place]  03/18/2018  8:30 AM Vevelyn Francois, NP [facility] None   Primary Care Physician: Lavera Guise, MD Location: [facility] Management Facility Note by: Vevelyn Francois NP Date: 09/24/2017; Time: 10:22 AM  Pain Score Disclaimer: We use the NRS-11 scale. This is a self-reported, subjective measurement of pain severity with only modest accuracy. It is used primarily to identify changes within a particular patient. It must be understood that outpatient pain scales are significantly less accurate that those used for research, where they can be applied under ideal controlled circumstances with minimal exposure to variables. In reality, the score is likely to be a combination of pain intensity and pain affect, where pain affect describes the degree of emotional arousal or changes in action readiness caused by the sensory experience of pain. Factors such as social and work situation, setting, emotional state, anxiety levels, expectation, and prior pain experience may influence pain perception and show large inter-individual differences that may also be affected by time variables.  Patient instructions provided during this  appointment: Patient Instructions   ____________________________________________________________________________________________  Pain Scale  Introduction: The pain score used by this practice is the Verbal Numerical Rating Scale (VNRS-11). This is an 11-point scale. It is for adults and children 10 years or older. There are significant differences in how the pain score is reported, used, and applied. Forget everything you learned in the past and learn this scoring system.  General Information: The scale should reflect your current level of pain. Unless you are specifically asked for the level of your  worst pain, or your average pain. If you are asked for one of these two, then it should be understood that it is over the past 24 hours.  Basic Activities of Daily Living (ADL): Personal hygiene, dressing, eating, transferring, and using restroom.  Instructions: Most patients tend to report their level of pain as a combination of two factors, their physical pain and their psychosocial pain. This last one is also known as "suffering" and it is reflection of how physical pain affects you socially and psychologically. From now on, report them separately. From this point on, when asked to report your pain level, report only your physical pain. Use the following table for reference.  Pain Clinic Pain Levels (0-5/10)  Pain Level Score  Description  No Pain 0   Mild pain 1 Nagging, annoying, but does not interfere with basic activities of daily living (ADL). Patients are able to eat, bathe, get dressed, toileting (being able to get on and off the toilet and perform personal hygiene functions), transfer (move in and out of bed or a chair without assistance), and maintain continence (able to control bladder and bowel functions). Blood pressure and heart rate are unaffected. A normal heart rate for a healthy adult ranges from 60 to 100 bpm (beats per minute).   Mild to moderate pain 2 Noticeable and distracting. Impossible to hide from other people. More frequent flare-ups. Still possible to adapt and function close to normal. It can be very annoying and may have occasional stronger flare-ups. With discipline, patients may get used to it and adapt.   Moderate pain 3 Interferes significantly with activities of daily living (ADL). It becomes difficult to feed, bathe, get dressed, get on and off the toilet or to perform personal hygiene functions. Difficult to get in and out of bed or a chair without assistance. Very distracting. With effort, it can be ignored when deeply involved in activities.   Moderately  severe pain 4 Impossible to ignore for more than a few minutes. With effort, patients may still be able to manage work or participate in some social activities. Very difficult to concentrate. Signs of autonomic nervous system discharge are evident: dilated pupils (mydriasis); mild sweating (diaphoresis); sleep interference. Heart rate becomes elevated (>115 bpm). Diastolic blood pressure (lower number) rises above 100 mmHg. Patients find relief in laying down and not moving.   Severe pain 5 Intense and extremely unpleasant. Associated with frowning face and frequent crying. Pain overwhelms the senses.  Ability to do any activity or maintain social relationships becomes significantly limited. Conversation becomes difficult. Pacing back and forth is common, as getting into a comfortable position is nearly impossible. Pain wakes you up from deep sleep. Physical signs will be obvious: pupillary dilation; increased sweating; goosebumps; brisk reflexes; cold, clammy hands and feet; nausea, vomiting or dry heaves; loss of appetite; significant sleep disturbance with inability to fall asleep or to remain asleep. When persistent, significant weight loss is observed due to the complete loss of  appetite and sleep deprivation.  Blood pressure and heart rate becomes significantly elevated. Caution: If elevated blood pressure triggers a pounding headache associated with blurred vision, then the patient should immediately seek attention at an urgent or emergency care unit, as these may be signs of an impending stroke.    Emergency Department Pain Levels (6-10/10)  Emergency Room Pain 6 Severely limiting. Requires emergency care and should not be seen or managed at an outpatient pain management facility. Communication becomes difficult and requires great effort. Assistance to reach the emergency department may be required. Facial flushing and profuse sweating along with potentially dangerous increases in heart rate and  blood pressure will be evident.   Distressing pain 7 Self-care is very difficult. Assistance is required to transport, or use restroom. Assistance to reach the emergency department will be required. Tasks requiring coordination, such as bathing and getting dressed become very difficult.   Disabling pain 8 Self-care is no longer possible. At this level, pain is disabling. The individual is unable to do even the most "basic" activities such as walking, eating, bathing, dressing, transferring to a bed, or toileting. Fine motor skills are lost. It is difficult to think clearly.   Incapacitating pain 9 Pain becomes incapacitating. Thought processing is no longer possible. Difficult to remember your own name. Control of movement and coordination are lost.   The worst pain imaginable 10 At this level, most patients pass out from pain. When this level is reached, collapse of the autonomic nervous system occurs, leading to a sudden drop in blood pressure and heart rate. This in turn results in a temporary and dramatic drop in blood flow to the brain, leading to a loss of consciousness. Fainting is one of the body's self defense mechanisms. Passing out puts the brain in a calmed state and causes it to shut down for a while, in order to begin the healing process.    Summary: 1. Refer to this scale when providing [place] with your pain level. 2. Be accurate and careful when reporting your pain level. This will help with your care. 3. Over-reporting your pain level will lead to loss of credibility. 4. Even a level of 1/10 means that there is pain and will be treated at our facility. 5. High, inaccurate reporting will be documented as "Symptom Exaggeration", leading to loss of credibility and suspicions of possible secondary gains such as obtaining more narcotics, or wanting to appear disabled, for fraudulent reasons. 6. Only pain levels of 5 or below will be seen at our facility. 7. Pain levels of 6 and above will  be sent to the Emergency Department and the appointment cancelled. ____________________________________________________________________________________________    ____________________________________________________________________________________________  Medication Rules  Applies to: All patients receiving prescriptions (written or electronic).  Pharmacy of record: Pharmacy where electronic prescriptions will be sent. If written prescriptions are taken to a different pharmacy, please inform the nursing staff. The pharmacy listed in the electronic medical record should be the one where you would like electronic prescriptions to be sent.  Prescription refills: Only during scheduled appointments. Applies to both, written and electronic prescriptions.  NOTE: The following applies primarily to controlled substances (Opioid* Pain Medications).   Patient's responsibilities: 1. Pain Pills: Bring all pain pills to every appointment (except for procedure appointments). 2. Pill Bottles: Bring pills in original pharmacy bottle. Always bring newest bottle. Bring bottle, even if empty. 3. Medication refills: You are responsible for knowing and keeping track of what medications you need refilled. The day before  your appointment, write a list of all prescriptions that need to be refilled. Bring that list to your appointment and give it to the admitting nurse. Prescriptions will be written only during appointments. If you forget a medication, it will not be "Called in", "Faxed", or "electronically sent". You will need to get another appointment to get these prescribed. 4. Prescription Accuracy: You are responsible for carefully inspecting your prescriptions before leaving our office. Have the discharge nurse carefully go over each prescription with you, before taking them home. Make sure that your name is accurately spelled, that your address is correct. Check the name and dose of your medication to make sure  it is accurate. Check the number of pills, and the written instructions to make sure they are clear and accurate. Make sure that you are given enough medication to last until your next medication refill appointment. 5. Taking Medication: Take medication as prescribed. Never take more pills than instructed. Never take medication more frequently than prescribed. Taking less pills or less frequently is permitted and encouraged, when it comes to controlled substances (written prescriptions).  6. Inform other Doctors: Always inform, all of your healthcare providers, of all the medications you take. 7. Pain Medication from other Providers: You are not allowed to accept any additional pain medication from any other Doctor or Healthcare provider. There are two exceptions to this rule. (see below) In the event that you require additional pain medication, you are responsible for notifying us, as stated below. 8. Medication Agreement: You are responsible for carefully reading and following our Medication Agreement. This must be signed before receiving any prescriptions from our practice. Safely store a copy of your signed Agreement. Violations to the Agreement will result in no further prescriptions. (Additional copies of our Medication Agreement are available upon request.) 9. Laws, Rules, & Regulations: All patients are expected to follow all Federal and [employer], [employer], Rules, [employer]. Ignorance of the Laws does not constitute a valid excuse. The use of any illegal substances is prohibited. 10. Adopted CDC guidelines & recommendations: Target dosing levels will be at or below 60 MME/day. Use of benzodiazepines** is not recommended.  Exceptions: There are only two exceptions to the rule of not receiving pain medications from other Healthcare Providers. 1. Exception #1 (Emergencies): In the event of an emergency (i.e.: accident requiring emergency care), you are allowed to receive additional pain  medication. However, you are responsible for: As soon as you are able, call our office [phone], at any time of the day or night, and leave a message stating your name, the date and nature of the emergency, and the name and dose of the medication prescribed. In the event that your call is answered by a member of our staff, make sure to document and save the date, time, and the name of the person that took your information.  2. Exception #2 (Planned Surgery): In the event that you are scheduled by another doctor or dentist to have any type of surgery or procedure, you are allowed (for a period no longer than 30 days), to receive additional pain medication, for the acute post-op pain. However, in this case, you are responsible for picking up a copy of our "Post-op Pain Management for Surgeons" handout, and giving it to your surgeon or dentist. This document is available at our office, and does not require an appointment to obtain it. Simply go to our office during business hours (Monday-Thursday from 8:00 AM to 4:00 PM) (Friday 8:00  AM to 12:00 Noon) or if you have a scheduled appointment with [place], prior to your surgery, and ask for it by name. In addition, you will need to provide [place] with your name, name of your surgeon, type of surgery, and date of procedure or surgery.  *Opioid medications include: morphine, codeine, oxycodone, oxymorphone, hydrocodone, hydromorphone, meperidine, tramadol, tapentadol, buprenorphine, fentanyl, methadone. **Benzodiazepine medications include: diazepam (Valium), alprazolam (Xanax), clonazepam (Klonopine), lorazepam (Ativan), clorazepate (Tranxene), chlordiazepoxide (Librium), estazolam (Prosom), oxazepam (Serax), temazepam (Restoril), triazolam (Halcion)  ____________________________________________________________________________________________   BMI Assessment: Estimated body mass index is 28.19 kg/m as calculated from the following:   Height as of this encounter:  '5\' 7"'  (1.702 m).   Weight as of this encounter: 180 lb (81.6 kg).

## 2017-09-24 NOTE — Progress Notes (Signed)
Nursing Pain Medication Assessment:  Safety precautions to be maintained throughout the outpatient stay will include: orient to surroundings, keep bed in low position, maintain call bell within reach at all times, provide assistance with transfer out of bed and ambulation.  Medication Inspection Compliance: Pill count conducted under aseptic conditions, in front of the patient. Neither the pills nor the bottle was removed from the patient's sight at any time. Once count was completed pills were immediately returned to the patient in their original bottle.  Medication: Tramadol (Ultram) Pill/Patch Count: 127 of 240 pills remain Pill/Patch Appearance: Markings consistent with prescribed medication Bottle Appearance: Standard pharmacy container. Clearly labeled. Filled Date: 36 / 20 / 2018 Last Medication intake:  Today

## 2017-10-02 ENCOUNTER — Other Ambulatory Visit: Payer: Self-pay | Admitting: Nurse Practitioner

## 2017-10-02 DIAGNOSIS — G894 Chronic pain syndrome: Secondary | ICD-10-CM

## 2017-10-09 ENCOUNTER — Other Ambulatory Visit: Payer: Self-pay | Admitting: Nurse Practitioner

## 2017-10-09 ENCOUNTER — Telehealth: Payer: Self-pay | Admitting: *Deleted

## 2017-10-09 MED ORDER — DIAZEPAM 5 MG PO TABS: 5.0000 mg | ORAL_TABLET | ORAL | 0 refills | Status: DC | PRN

## 2017-10-09 NOTE — Telephone Encounter (Signed)
Shawna Carrillo,            You ordered this MRI. Are you willing to order this patient something? Her allergies are latex and codeine.

## 2017-10-09 NOTE — Telephone Encounter (Signed)
Called to [facility] in [place] per patient request. Patient called with instructions and verbalized understanding of such.

## 2017-10-09 NOTE — Telephone Encounter (Signed)
Hi I placed an order for Valium. Can you phone this in? Thanks.

## 2017-10-16 ENCOUNTER — Ambulatory Visit: Payer: Self-pay | Admitting: Nurse Practitioner

## 2017-10-22 ENCOUNTER — Ambulatory Visit: Payer: Self-pay | Admitting: Nurse Practitioner

## 2017-10-31 ENCOUNTER — Other Ambulatory Visit: Payer: Self-pay | Admitting: Nurse Practitioner

## 2017-11-23 ENCOUNTER — Ambulatory Visit: Payer: Self-pay | Admitting: Nurse Practitioner

## 2017-11-27 ENCOUNTER — Ambulatory Visit: Payer: BLUE CROSS/BLUE SHIELD | Admitting: Nurse Practitioner

## 2017-11-27 ENCOUNTER — Encounter: Payer: Self-pay | Admitting: Nurse Practitioner

## 2017-11-27 VITALS — BP 145/98 | HR 104 | Resp 16 | Ht 67.0 in | Wt 199.2 lb

## 2017-11-27 DIAGNOSIS — M5441 Lumbago with sciatica, right side: Secondary | ICD-10-CM | POA: Diagnosis not present

## 2017-11-27 DIAGNOSIS — M62838 Other muscle spasm: Secondary | ICD-10-CM

## 2017-11-27 DIAGNOSIS — M5442 Lumbago with sciatica, left side: Secondary | ICD-10-CM

## 2017-11-27 DIAGNOSIS — F17210 Nicotine dependence, cigarettes, uncomplicated: Secondary | ICD-10-CM | POA: Diagnosis not present

## 2017-11-27 DIAGNOSIS — G8929 Other chronic pain: Secondary | ICD-10-CM | POA: Diagnosis not present

## 2017-11-27 MED ORDER — VARENICLINE TARTRATE 1 MG PO TABS: ORAL_TABLET | ORAL | 3 refills | Status: DC

## 2017-11-27 MED ORDER — DIAZEPAM 5 MG PO TABS: 5.0000 mg | ORAL_TABLET | Freq: Every evening | ORAL | 1 refills | Status: DC | PRN

## 2017-11-27 MED ORDER — VARENICLINE TARTRATE 0.5 MG X 11 & 1 MG X 42 PO MISC: ORAL | 0 refills | Status: DC

## 2017-11-27 NOTE — Progress Notes (Signed)
[facility] [place],  [address]  Internal MEDICINE  Office Visit Note  Patient Name: Shawna Carrillo  [id number]  [id number]  Date of Service: 12/19/2017  Chief Complaint  Patient presents with  . Back Pain    chronic. having leg spasms. mirapex did not work.   . Nicotine Dependence    wants to quit smoking and would like to try chantix.     The patient is here for routine follow up exam. Today, she has complaint of low back pain which feels like tight muscles. This is worse at night, after she has been active the entire day. Muscle tightness can get so bad it is difficult for her to fall asleep. She has taken tylenol and aleve which do help some during the day. She states that she would also like to quit smoking. She has tried this on her own and has used the nicotine patch and nicotine gum, and has not been successful. She would like to try chantix.    Pt is here for routine follow up.    Current Medication: Outpatient Encounter Medications as of 11/27/2017  Medication Sig  . DULoxetine (CYMBALTA) 60 MG capsule Take 1 capsule (60 mg total) by mouth daily.  Marland Kitchen ibuprofen (ADVIL,MOTRIN) 200 MG tablet Take 800 mg by mouth as needed for mild pain.  . traMADol (ULTRAM) 50 MG tablet Take 2 tablets (100 mg total) by mouth every 6 (six) hours as needed.  . diazepam (VALIUM) 5 MG tablet Take 1 tablet (5 mg total) by mouth at bedtime as needed for anxiety or muscle spasms.  . varenicline (CHANTIX CONTINUING MONTH PAK) 1 MG tablet Take by mouth as directed  . varenicline (CHANTIX STARTING MONTH PAK) 0.5 MG X 11 & 1 MG X 42 tablet Take by mouth as directed for smoking cessation  . [DISCONTINUED] diazepam (VALIUM) 5 MG tablet Take 1 tablet (5 mg total) by mouth as needed for up to 2 doses for anxiety (Take one tab 45 minutes before MRI. Take second tablet just prior to MRI scan). Do not take medication within 4 hours of taking opioid pain medications. Must have a  driver. Do not drive or operate machinery x 24 hours after taking this medication.  . [DISCONTINUED] pramipexole (MIRAPEX) 0.125 MG tablet take 1-3 tablets by mouth at bedtime if needed for restlessness legs   No facility-administered encounter medications on file as of 11/27/2017.     Surgical History: Past Surgical History:  Procedure Laterality Date  . hilar myectomy    . LUMBAR FUSION  2012   FAILED    Medical History: Past Medical History:  Diagnosis Date  . B12 deficiency   . History of anemia 07/12/2015  . History of migraine 07/12/2015  . History of peptic ulcer disease 07/12/2015  . Insomnia     Family History: Family History  Problem Relation Age of Onset  . Diabetes Mother   . Heart disease Father     Social History   Socioeconomic History  . Marital status: Married    Spouse name: Not on file  . Number of children: Not on file  . Years of education: Not on file  . Highest education level: Not on file  Occupational History  . Not on file  Social Needs  . Financial resource strain: Not on file  . Food insecurity:    Worry: Not on file    Inability: Not on file  . Transportation needs:    Medical:  Not on file    Non-medical: Not on file  Tobacco Use  . Smoking status: Current Every Day Smoker    Packs/day: 1.00    Types: Cigarettes  . Smokeless tobacco: Never Used  Substance and Sexual Activity  . Alcohol use: No    Alcohol/week: 0.0 oz  . Drug use: No  . Sexual activity: Not on file  Lifestyle  . Physical activity:    Days per week: Not on file    Minutes per session: Not on file  . Stress: Not on file  Relationships  . Social connections:    Talks on phone: Not on file    Gets together: Not on file    Attends religious service: Not on file    Active member of club or organization: Not on file    Attends meetings of clubs or organizations: Not on file    Relationship status: Not on file  . Intimate partner violence:    Fear of current  or ex partner: Not on file    Emotionally abused: Not on file    Physically abused: Not on file    Forced sexual activity: Not on file  Other Topics Concern  . Not on file  Social History Narrative  . Not on file      Review of Systems  Constitutional: Negative for activity change, chills, fatigue and unexpected weight change.  HENT: Negative for congestion, postnasal drip, rhinorrhea, sneezing and sore throat.   Eyes: Negative.  Negative for redness.  Respiratory: Negative for cough, chest tightness, shortness of breath and wheezing.   Cardiovascular: Negative for chest pain and palpitations.       Blood pressure slightly elevated.   Gastrointestinal: Negative for abdominal pain, constipation, diarrhea, nausea and vomiting.  Endocrine: Negative for cold intolerance, heat intolerance, polydipsia, polyphagia and polyuria.  Genitourinary: Negative for dysuria and frequency.  Musculoskeletal: Positive for back pain and myalgias. Negative for arthralgias, joint swelling and neck pain.  Skin: Negative for rash.  Allergic/Immunologic: Negative for environmental allergies.  Neurological: Negative for tremors, numbness and headaches.  Hematological: Does not bruise/bleed easily.  Psychiatric/Behavioral: Negative for behavioral problems (Depression), dysphoric mood, sleep disturbance and suicidal ideas. The patient is not nervous/anxious.     Today's Vitals   11/27/17 1442  BP: (!) 145/98  Pulse: (!) 104  Resp: 16  SpO2: 96%  Weight: 199 lb 3.2 oz (90.4 kg)  Height: 5\' 7"  (1.702 m)    Physical Exam  Constitutional: She is oriented to person, place, and time. She appears well-developed and well-nourished. No distress.  HENT:  Head: Normocephalic and atraumatic.  Mouth/Throat: Oropharynx is clear and moist. No oropharyngeal exudate.  Eyes: Pupils are equal, round, and reactive to light. EOM are normal.  Neck: Normal range of motion. Neck supple. No JVD present. No tracheal  deviation present. No thyromegaly present.  Cardiovascular: Normal rate, regular rhythm and normal heart sounds. Exam reveals no gallop and no friction rub.  No murmur heard. Pulmonary/Chest: Effort normal and breath sounds normal. No respiratory distress. She has no wheezes. She has no rales. She exhibits no tenderness.  Abdominal: Soft. Bowel sounds are normal. There is no tenderness.  Musculoskeletal: Normal range of motion.  Mild to moderate lower back pain which is worse with bending and twisting at the waist. Pain is also more severe when lifting, pushing, or lifting with the arms. There are no palpable abnormalities or deformities noted at this time.   Lymphadenopathy:    She  has no cervical adenopathy.  Neurological: She is alert and oriented to person, place, and time. No cranial nerve deficit.  Skin: Skin is warm and dry. She is not diaphoretic.  Psychiatric: She has a normal mood and affect. Her behavior is normal. Judgment and thought content normal.  Nursing note and vitals reviewed.   Assessment/Plan: 1. Muscle spasm - diazepam (VALIUM) 5 MG tablet; Take 1 tablet (5 mg total) by mouth at bedtime as needed for anxiety or muscle spasms.  Dispense: 30 tablet; Refill: 1  2. Chronic bilateral low back pain with bilateral sciatica (P) (B) (R>L) Continue regular visits with pain management/neurosurgery for treatment.   3. Cigarette smoker - varenicline (CHANTIX STARTING MONTH PAK) 0.5 MG X 11 & 1 MG X 42 tablet; Take by mouth as directed for smoking cessation  Dispense: 53 tablet; Refill: 0 - varenicline (CHANTIX CONTINUING MONTH PAK) 1 MG tablet; Take by mouth as directed  Dispense: 60 tablet; Refill: 3   General Counseling: Jakita verbalizes understanding of the findings of todays visit and agrees with plan of treatment. I have discussed any further diagnostic evaluation that may be needed or ordered today. We also reviewed her medications today. she has been encouraged to call the  office with any questions or concerns that should arise related to todays visit.   This patient was seen by Leretha Pol, FNP- C in Collaboration with Dr Lavera Guise as a part of collaborative care agreement  Meds ordered this encounter  Medications  . diazepam (VALIUM) 5 MG tablet    Sig: Take 1 tablet (5 mg total) by mouth at bedtime as needed for anxiety or muscle spasms.    Dispense:  30 tablet    Refill:  1    Order Specific Question:   Supervising Provider    Answer:   Lavera Guise [[id number]]  . varenicline (CHANTIX STARTING MONTH PAK) 0.5 MG X 11 & 1 MG X 42 tablet    Sig: Take by mouth as directed for smoking cessation    Dispense:  53 tablet    Refill:  0    Order Specific Question:   Supervising Provider    Answer:   Lavera Guise [[id number]]  . varenicline (CHANTIX CONTINUING MONTH PAK) 1 MG tablet    Sig: Take by mouth as directed    Dispense:  60 tablet    Refill:  3    Order Specific Question:   Supervising Provider    Answer:   Lavera Guise [[id number]]    Time spent: 20 Minutes       Dr Lavera Guise Internal medicine

## 2017-12-19 DIAGNOSIS — M62838 Other muscle spasm: Secondary | ICD-10-CM | POA: Insufficient documentation

## 2017-12-19 DIAGNOSIS — F17210 Nicotine dependence, cigarettes, uncomplicated: Secondary | ICD-10-CM | POA: Insufficient documentation

## 2018-03-04 ENCOUNTER — Other Ambulatory Visit: Payer: Self-pay | Admitting: Nurse Practitioner

## 2018-03-18 ENCOUNTER — Ambulatory Visit: Payer: Worker's Compensation | Attending: Nurse Practitioner | Admitting: Nurse Practitioner

## 2018-03-18 ENCOUNTER — Other Ambulatory Visit: Payer: Self-pay

## 2018-03-18 ENCOUNTER — Encounter: Payer: Self-pay | Admitting: Nurse Practitioner

## 2018-03-18 VITALS — BP 151/107 | HR 102 | Temp 97.8°F | Resp 18 | Ht 67.0 in | Wt 190.0 lb

## 2018-03-18 DIAGNOSIS — F17210 Nicotine dependence, cigarettes, uncomplicated: Secondary | ICD-10-CM | POA: Diagnosis not present

## 2018-03-18 DIAGNOSIS — M47816 Spondylosis without myelopathy or radiculopathy, lumbar region: Secondary | ICD-10-CM

## 2018-03-18 DIAGNOSIS — Z8249 Family history of ischemic heart disease and other diseases of the circulatory system: Secondary | ICD-10-CM | POA: Insufficient documentation

## 2018-03-18 DIAGNOSIS — Z91040 Latex allergy status: Secondary | ICD-10-CM | POA: Insufficient documentation

## 2018-03-18 DIAGNOSIS — M5441 Lumbago with sciatica, right side: Secondary | ICD-10-CM | POA: Insufficient documentation

## 2018-03-18 DIAGNOSIS — E785 Hyperlipidemia, unspecified: Secondary | ICD-10-CM | POA: Diagnosis not present

## 2018-03-18 DIAGNOSIS — G8929 Other chronic pain: Secondary | ICD-10-CM

## 2018-03-18 DIAGNOSIS — E538 Deficiency of other specified B group vitamins: Secondary | ICD-10-CM | POA: Insufficient documentation

## 2018-03-18 DIAGNOSIS — K219 Gastro-esophageal reflux disease without esophagitis: Secondary | ICD-10-CM | POA: Insufficient documentation

## 2018-03-18 DIAGNOSIS — Z9889 Other specified postprocedural states: Secondary | ICD-10-CM | POA: Insufficient documentation

## 2018-03-18 DIAGNOSIS — Z885 Allergy status to narcotic agent status: Secondary | ICD-10-CM | POA: Insufficient documentation

## 2018-03-18 DIAGNOSIS — G894 Chronic pain syndrome: Secondary | ICD-10-CM | POA: Insufficient documentation

## 2018-03-18 DIAGNOSIS — M4326 Fusion of spine, lumbar region: Secondary | ICD-10-CM | POA: Diagnosis not present

## 2018-03-18 DIAGNOSIS — Z79899 Other long term (current) drug therapy: Secondary | ICD-10-CM | POA: Insufficient documentation

## 2018-03-18 DIAGNOSIS — M79604 Pain in right leg: Secondary | ICD-10-CM

## 2018-03-18 DIAGNOSIS — Z791 Long term (current) use of non-steroidal anti-inflammatories (NSAID): Secondary | ICD-10-CM | POA: Insufficient documentation

## 2018-03-18 DIAGNOSIS — Z833 Family history of diabetes mellitus: Secondary | ICD-10-CM | POA: Diagnosis not present

## 2018-03-18 DIAGNOSIS — M5442 Lumbago with sciatica, left side: Secondary | ICD-10-CM | POA: Insufficient documentation

## 2018-03-18 DIAGNOSIS — Z79891 Long term (current) use of opiate analgesic: Secondary | ICD-10-CM | POA: Insufficient documentation

## 2018-03-18 DIAGNOSIS — D649 Anemia, unspecified: Secondary | ICD-10-CM | POA: Diagnosis not present

## 2018-03-18 MED ORDER — TRAMADOL HCL 50 MG PO TABS: 100.0000 mg | ORAL_TABLET | Freq: Four times a day (QID) | ORAL | 5 refills | Status: DC | PRN

## 2018-03-18 MED ORDER — DULOXETINE HCL 60 MG PO CPEP
60.0000 mg | ORAL_CAPSULE | Freq: Every day | ORAL | 0 refills | Status: DC
Start: 2018-03-28 — End: 2018-09-18

## 2018-03-18 NOTE — Progress Notes (Signed)
Patient's Name: Shawna Carrillo  MRN: [id number]  Referring Provider: Lavera Guise, MD  DOB: 03/18/1961  PCP: Lavera Guise, MD  DOS: 03/18/2018  Note by: Vevelyn Francois NP  Service setting: Ambulatory outpatient  Specialty: Interventional Pain Management  Location: [facility] (AMB) Pain Management Facility    Patient type: Established    Primary Reason(s) for Visit: Encounter for prescription drug management. (Level of risk: moderate)  CC: Back Pain (low) and Leg Pain (right)  HPI  Shawna Carrillo is a 57 y.o. year old, female patient, who comes today for a medication management evaluation. 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 Her primarily concern today is the Back Pain (low) and Leg Pain (right)  Pain Assessment: Location: Lower Back Radiating: right leg, right foot Onset: More than a month ago Quality: Constant, Aching, Numbness, Tingling, Sharp Severity: 5 /10 (subjective, self-reported pain score)  Note: Reported level is compatible with observation.                          Timing: Constant Modifying factors: medications, position changes BP: (!) 151/107(left)  HR: (!) 102  Shawna Carrillo was last scheduled for an appointment on 59/22/2019 for medication management. During today's appointment we reviewed Shawna Carrillo's chronic pain status, as well as her outpatient medication regimen. She admits that she has numbness  and tingling in her right leg. She denies any weakness. She states that she did have a flare up last month. She used heat and ice and after a while this resolved.  She admits that she had an MRI completed at [facility]. She denies any side effects of her medications. She admits that she generally uses 6 tabs per day but would like to continue with the 8 tabs for flare ups etc.   The patient  reports that she does not use drugs. Her body mass index is 29.76 kg/m.  Further details on both, my assessment(s), as well as the proposed treatment plan, please see below.  Controlled Substance Pharmacotherapy Assessment REMS (Risk Evaluation and Mitigation Strategy)  Analgesic:Tramadol 100 mg every 6 hours (400 mg/day of tramadol) MME/day:40 mg/day    [name], RN  03/18/2018  8:43 AM  Sign at close encounter Nursing Pain Medication Assessment:  Safety precautions to be maintained throughout the outpatient stay will include: orient to surroundings, keep bed in low position, maintain call bell within reach at all times, provide assistance with transfer out of bed and ambulation.  Medication Inspection Compliance: Pill count conducted under aseptic conditions, in front of the patient. Neither the pills nor the bottle was removed from the patient's sight at any time. Once count was completed pills were immediately returned to the patient in their original bottle.  Medication: Tramadol (Ultram) Pill/Patch Count: 277 of 240 pills remain Pill/Patch Appearance: Markings consistent with prescribed medication Bottle Appearance: Standard pharmacy container. Clearly labeled. Filled Date: 06 / 30 / 2019 Last Medication intake:  Today   Pharmacokinetics: Liberation and absorption (  onset of action): WNL Distribution (time to peak effect): WNL Metabolism and excretion (duration of action): WNL         Pharmacodynamics: Desired effects: Analgesia: Shawna Carrillo reports >50%  benefit. Functional ability: Patient reports that medication allows her to accomplish basic ADLs Clinically meaningful improvement in function (CMIF): Sustained CMIF goals met Perceived effectiveness: Described as relatively effective, allowing for increase in activities of daily living (ADL) Undesirable effects: Side-effects or Adverse reactions: None reported Monitoring: [place] PMP: Online review of the past 60-monthperiod conducted. Compliant with practice rules and regulations Last UDS on record: Summary  Date Value Ref Range Status  03/21/2017 FINAL  Final    Comment:    ==================================================================== TOXASSURE SELECT 13 (MW) ==================================================================== Test                             Result       Flag       Units Drug Present and Declared for Prescription Verification   Tramadol                       PRESENT      EXPECTED   O-Desmethyltramadol            PRESENT      EXPECTED   N-Desmethyltramadol            PRESENT      EXPECTED    Source of tramadol is a prescription medication.    O-desmethyltramadol and N-desmethyltramadol are expected    metabolites of tramadol. ==================================================================== Test                      Result    Flag   Units      Ref Range   Creatinine              45               mg/dL      >=20 ==================================================================== Declared Medications:  The flagging and interpretation on this report are based on the  following declared medications.  Unexpected results may arise from  inaccuracies in the declared medications.  **Note: The testing scope of this panel includes these medications:  Tramadol  **Note: The testing scope of this panel does not include following  reported medications:  Duloxetine (Cymbalta)  Ibuprofen   Pramipexole ==================================================================== For clinical consultation, please call [phone] ====================================================================    UDS interpretation: Compliant          Medication Assessment Form: Reviewed. Patient indicates being compliant with therapy Treatment compliance: Compliant Risk Assessment Profile: Aberrant behavior: See prior evaluations. None observed or detected today Comorbid factors increasing risk of overdose: See prior notes. No additional risks detected today Risk of substance use disorder (SUD): Low Opioid Risk Tool - 03/18/18 0841      Family History of Substance Abuse   Alcohol  Negative    Illegal Drugs  Negative    Rx Drugs  Negative      Personal History of Substance Abuse   Alcohol  Negative    Illegal Drugs  Negative    Rx Drugs  Negative      Age   Age between 125-45years   No      History of Preadolescent Sexual Abuse   History of Preadolescent Sexual Abuse  Negative or Female      Psychological Disease   Psychological Disease  Negative    Depression  Negative      Total Score   Opioid Risk Tool Scoring  0    Opioid Risk Interpretation  Low Risk      ORT Scoring interpretation table:  Score <3 = Low Risk for SUD  Score between 4-7 = Moderate Risk for SUD  Score >8 = High Risk for Opioid Abuse   Risk Mitigation Strategies:  Patient Counseling: Covered Patient-Prescriber Agreement (PPA): Present and active  Notification to other healthcare providers: Done  Pharmacologic Plan: No change in therapy, at this time.             Laboratory Chemistry  Inflammation Markers (CRP: Acute Phase) (ESR: Chronic Phase) Lab Results  Component Value Date   ESRSEDRATE 11 12/29/2014                         Rheumatology Markers No results found for: RF, ANA, LABURIC, URICUR, LYMEIGGIGMAB, LYMEABIGMQN, HLAB27                      Renal Function Markers Lab Results   Component Value Date   BUN 8 02/17/2015   CREATININE 0.71 02/17/2015   BCR 11 02/17/2015   GFRAA 111 02/17/2015   GFRNONAA 96 02/17/2015                             Hepatic Function Markers Lab Results  Component Value Date   AST 24 02/17/2015   ALT 23 02/17/2015   ALBUMIN 4.6 02/17/2015   ALKPHOS 63 02/17/2015                        Electrolytes Lab Results  Component Value Date   NA 142 02/17/2015   K 4.5 02/17/2015   CL 103 02/17/2015   CALCIUM 10.2 02/17/2015   MG 1.9 06/19/2014                        Neuropathy Markers Lab Results  Component Value Date   VITAMINB12 243 02/17/2015                        Bone Pathology Markers No results found for: [place], [id number], [id number], [id number], 25OHVITD1, 25OHVITD2, 25OHVITD3, TESTOFREE, TESTOSTERONE                       Coagulation Parameters Lab Results  Component Value Date   PLT 380 12/29/2014                        Cardiovascular Markers Lab Results  Component Value Date   HGB 13.6 12/29/2014   HCT 39.7 12/29/2014                         CA Markers No results found for: CEA, CA125, LABCA2                      Note: Lab results reviewed.  Recent Diagnostic Imaging Results  DG Shoulder Left Portable CLINICAL DATA:  Shoulder dislocation  EXAM: LEFT SHOULDER - 1 VIEW  COMPARISON:  Left shoulder radiograph earlier the same day.  FINDINGS: Interval reduction of left glenohumeral dislocation. No fracture visualized.  IMPRESSION: Reduction of left glenohumeral dislocation, now in anatomic alignment.  Electronically Signed   By: Ulyses Jarred M.D.   On: 10/31/2016 06:26 DG Shoulder Left CLINICAL DATA:  Shoulder dislocation  EXAM: LEFT SHOULDER - 2+ VIEW  COMPARISON:  None.  FINDINGS: There is anterior dislocation of the left glenohumeral joint. No fracture identified.  IMPRESSION: Anterior left glenohumeral dislocation.  Electronically Signed   By: Ulyses Jarred M.D.   On:  10/31/2016 06:00  Complexity Note: Imaging results reviewed. Results shared with Ms. Stoutenburg, using Layman's terms.                         Meds   Current Outpatient Medications:  .  diazepam (VALIUM) 5 MG tablet, Take 1 tablet (5 mg total) by mouth at bedtime as needed for anxiety or muscle spasms., Disp: 30 tablet, Rfl: 1 .  [START ON 03/28/2018] DULoxetine (CYMBALTA) 60 MG capsule, Take 1 capsule (60 mg total) by mouth daily., Disp: 180 capsule, Rfl: 0 .  ibuprofen (ADVIL,MOTRIN) 200 MG tablet, Take 800 mg by mouth as needed for mild pain., Disp: , Rfl:  .  [START ON 04/09/2018] traMADol (ULTRAM) 50 MG tablet, Take 2 tablets (100 mg total) by mouth every 6 (six) hours as needed., Disp: 240 tablet, Rfl: 5  ROS  Constitutional: Denies any fever or chills Gastrointestinal: No reported hemesis, hematochezia, vomiting, or acute GI distress Musculoskeletal: Denies any acute onset joint swelling, redness, loss of ROM, or weakness Neurological: No reported episodes of acute onset apraxia, aphasia, dysarthria, agnosia, amnesia, paralysis, loss of coordination, or loss of consciousness  Allergies  Ms. Merriott is allergic to latex and codeine.  PFSH  Drug: Ms. Zaugg  reports that she does not use drugs. Alcohol:  reports that she does not drink alcohol. Tobacco:  reports that she has been smoking cigarettes.  She has been smoking about 1.00 pack per day. She has never used smokeless tobacco. Medical:  has a past medical history of B12 deficiency, History of anemia (07/12/2015), History of migraine (07/12/2015), History of peptic ulcer disease (07/12/2015), and Insomnia. Surgical: Ms. Carignan  has a past surgical history that includes Lumbar fusion (2012) and hilar myectomy. Family: family history includes Diabetes in her mother; Heart disease in her father.  Constitutional Exam  General appearance: Well nourished, well developed, and well hydrated. In no apparent acute  distress Vitals:   03/18/18 0837 03/18/18 0838  BP: (!) 139/100 (!) 151/107  Pulse: (!) 102   Resp: 18   Temp: 97.8 F (36.6 C)   TempSrc: Oral   SpO2: 99%   Weight: 190 lb (86.2 kg)   Height: _0  (1.702 m)   Psych/Mental status: Alert, oriented x 3 (person, place, & time)       Eyes: PERLA Respiratory: No evidence of acute respiratory distress  Cervical Spine Area Exam  Skin & Axial Inspection: No masses, redness, edema, swelling, or associated skin lesions Alignment: Symmetrical Functional ROM: Unrestricted ROM      Stability: No instability detected Muscle Tone/Strength: Functionally intact. No obvious neuro-muscular anomalies detected. Sensory (Neurological): Unimpaired Palpation: No palpable anomalies              Upper Extremity (UE) Exam    Side: Right upper extremity  Side: Left upper extremity  Skin & Extremity Inspection: Skin color, temperature, and hair growth are WNL. No peripheral edema or cyanosis. No masses, redness, swelling, asymmetry, or associated skin lesions. No contractures.  Skin & Extremity Inspection: Skin color, temperature, and hair growth are  WNL. No peripheral edema or cyanosis. No masses, redness, swelling, asymmetry, or associated skin lesions. No contractures.  Functional ROM: Unrestricted ROM          Functional ROM: Unrestricted ROM          Muscle Tone/Strength: Functionally intact. No obvious neuro-muscular anomalies detected.  Muscle Tone/Strength: Functionally intact. No obvious neuro-muscular anomalies detected.  Sensory (Neurological): Unimpaired          Sensory (Neurological): Unimpaired          Palpation: No palpable anomalies              Palpation: No palpable anomalies              Provocative Test(s):  Phalen's test: deferred Tinel's test: deferred Apley's scratch test (touch opposite shoulder):  Action 1 (Across chest): deferred Action 2 (Overhead): deferred Action 3 (LB reach): deferred   Provocative Test(s):  Phalen's test:  deferred Tinel's test: deferred Apley's scratch test (touch opposite shoulder):  Action 1 (Across chest): deferred Action 2 (Overhead): deferred Action 3 (LB reach): deferred    Thoracic Spine Area Exam  Skin & Axial Inspection: No masses, redness, or swelling Alignment: Symmetrical Functional ROM: Unrestricted ROM Stability: No instability detected Muscle Tone/Strength: Functionally intact. No obvious neuro-muscular anomalies detected. Sensory (Neurological): Unimpaired Muscle strength & Tone: No palpable anomalies  Lumbar Spine Area Exam  Skin & Axial Inspection: No masses, redness, or swelling Alignment: Symmetrical Functional ROM: Unrestricted ROM       Stability: No instability detected Muscle Tone/Strength: Functionally intact. No obvious neuro-muscular anomalies detected. Sensory (Neurological): Unimpaired Palpation: No palpable anomalies       Provocative Tests: Lumbar Hyperextension/rotation test: (+)       Lumbar quadrant test (Kemp's test): deferred today       Lumbar Lateral bending test: (+)       Patrick's Maneuver: deferred today                   FABER test: deferred today       Thigh-thrust test: deferred today       S-I compression test: deferred today       S-I distraction test: deferred today        Gait & Posture Assessment  Ambulation: Unassisted Gait: Antalgic Posture: WNL   Lower Extremity Exam    Side: Right lower extremity  Side: Left lower extremity  Stability: No instability observed          Stability: No instability observed          Skin & Extremity Inspection: Skin color, temperature, and hair growth are WNL. No peripheral edema or cyanosis. No masses, redness, swelling, asymmetry, or associated skin lesions. No contractures.  Skin & Extremity Inspection: Skin color, temperature, and hair growth are WNL. No peripheral edema or cyanosis. No masses, redness, swelling, asymmetry, or associated skin lesions. No contractures.  Functional ROM:  Unrestricted ROM                  Functional ROM: Unrestricted ROM                  Muscle Tone/Strength: Functionally intact. No obvious neuro-muscular anomalies detected.  Muscle Tone/Strength: Functionally intact. No obvious neuro-muscular anomalies detected.  Sensory (Neurological): Unimpaired  Sensory (Neurological): Unimpaired  Palpation: No palpable anomalies  Palpation: No palpable anomalies   Assessment  Primary Diagnosis & Pertinent Problem List: The primary encounter diagnosis was Lumbar spondylosis. Diagnoses of Lumbar facet syndrome (  B) (R>L), Chronic pain of right lower extremity, Chronic pain syndrome, and Long term prescription opiate use were also pertinent to this visit.  Status Diagnosis  Persistent Persistent Persistent 1. Lumbar spondylosis   2. Lumbar facet syndrome (B) (R>L)   3. Chronic pain of right lower extremity   4. Chronic pain syndrome   5. Long term prescription opiate use     Problems updated and reviewed during this visit: No problems updated. Plan of Care  Pharmacotherapy (Medications Ordered): Meds ordered this encounter  Medications  . traMADol (ULTRAM) 50 MG tablet    Sig: Take 2 tablets (100 mg total) by mouth every 6 (six) hours as needed.    Dispense:  240 tablet    Refill:  5    Do not place this medication, or any other prescription from our practice, on "Automatic Refill". Patient may have prescription filled one day early if pharmacy is closed on scheduled refill date. May fill on:04/09/2018 To last Until: 10/06/2018    Order Specific Question:   Supervising Provider    Answer:   Milinda Pointer [phone]  . DULoxetine (CYMBALTA) 60 MG capsule    Sig: Take 1 capsule (60 mg total) by mouth daily.    Dispense:  180 capsule    Refill:  0    Do not place this medication, or any other prescription from our practice, on "Automatic Refill". Patient may have prescription filled one day early if pharmacy is closed on scheduled refill date.     Order Specific Question:   Supervising Provider    Answer:   Milinda Pointer [[id number]]   New Prescriptions   No medications on file   Medications administered today: Sydny Pfeffer had no medications administered during this visit. Lab-work, procedure(s), and/or referral(s): Orders Placed This Encounter  Procedures  . ToxASSURE Select 13 (MW), Urine   Imaging and/or referral(s): None  Interventional therapies: Planned, scheduled, and/or pending: None at this time.    Considering: Diagnostic bilateral lumbar facet block  Diagnostic caudal epidural steroid injection + epidurogram for the lower extremity pain   Palliative PRN treatment(s): Palliativebilateral lumbar facet block  Palliativecaudal epidural steroid injection + epidurogram for the lower extremity pain.      Provider-requested follow-up: Return in about 6 months (around 09/18/2018) for MedMgmt with Me Dionisio David), medical record release.  Future Appointments  Date Time Provider [place]  09/18/2018 12:45 PM Vevelyn Francois, NP [facility] None   Primary Care Physician: Lavera Guise, MD Location: [facility] Outpatient Pain Management Facility Note by: Vevelyn Francois NP Date: 03/18/2018; Time: 3:56 PM  Pain Score Disclaimer: We use the NRS-11 scale. This is a self-reported, subjective measurement of pain severity with only modest accuracy. It is used primarily to identify changes within a particular patient. It must be understood that outpatient pain scales are significantly less accurate that those used for research, where they can be applied under ideal controlled circumstances with minimal exposure to variables. In reality, the score is likely to be a combination of pain intensity and pain affect, where pain affect describes the degree of emotional arousal or changes in action readiness caused by the sensory experience of pain. Factors such as social and work situation, setting, emotional state,  anxiety levels, expectation, and prior pain experience may influence pain perception and show large inter-individual differences that may also be affected by time variables.  Patient instructions provided during this appointment: Patient Instructions  ____________________________________________________________________________________________  Medication Rules  Applies to: All patients  receiving prescriptions (written or electronic).  Pharmacy of record: Pharmacy where electronic prescriptions will be sent. If written prescriptions are taken to a different pharmacy, please inform the nursing staff. The pharmacy listed in the electronic medical record should be the one where you would like electronic prescriptions to be sent.  Prescription refills: Only during scheduled appointments. Applies to both, written and electronic prescriptions.  NOTE: The following applies primarily to controlled substances (Opioid* Pain Medications).   Patient's responsibilities: 1. Pain Pills: Bring all pain pills to every appointment (except for procedure appointments). 2. Pill Bottles: Bring pills in original pharmacy bottle. Always bring newest bottle. Bring bottle, even if empty. 3. Medication refills: You are responsible for knowing and keeping track of what medications you need refilled. The day before your appointment, write a list of all prescriptions that need to be refilled. Bring that list to your appointment and give it to the admitting nurse. Prescriptions will be written only during appointments. If you forget a medication, it will not be "Called in", "Faxed", or "electronically sent". You will need to get another appointment to get these prescribed. 4. Prescription Accuracy: You are responsible for carefully inspecting your prescriptions before leaving our office. Have the discharge nurse carefully go over each prescription with you, before taking them home. Make sure that your name is accurately  spelled, that your address is correct. Check the name and dose of your medication to make sure it is accurate. Check the number of pills, and the written instructions to make sure they are clear and accurate. Make sure that you are given enough medication to last until your next medication refill appointment. 5. Taking Medication: Take medication as prescribed. Never take more pills than instructed. Never take medication more frequently than prescribed. Taking less pills or less frequently is permitted and encouraged, when it comes to controlled substances (written prescriptions).  6. Inform other Doctors: Always inform, all of your healthcare providers, of all the medications you take. 7. Pain Medication from other Providers: You are not allowed to accept any additional pain medication from any other Doctor or Healthcare provider. There are two exceptions to this rule. (see below) In the event that you require additional pain medication, you are responsible for notifying us, as stated below. 8. Medication Agreement: You are responsible for carefully reading and following our Medication Agreement. This must be signed before receiving any prescriptions from our practice. Safely store a copy of your signed Agreement. Violations to the Agreement will result in no further prescriptions. (Additional copies of our Medication Agreement are available upon request.) 9. Laws, Rules, & Regulations: All patients are expected to follow all Federal and [employer], [employer], Rules, [employer]. Ignorance of the Laws does not constitute a valid excuse. The use of any illegal substances is prohibited. 10. Adopted CDC guidelines & recommendations: Target dosing levels will be at or below 60 MME/day. Use of benzodiazepines** is not recommended.  Exceptions: There are only two exceptions to the rule of not receiving pain medications from other Healthcare Providers. 1. Exception #1 (Emergencies): In the event of an emergency  (i.e.: accident requiring emergency care), you are allowed to receive additional pain medication. However, you are responsible for: As soon as you are able, call our office [phone], at any time of the day or night, and leave a message stating your name, the date and nature of the emergency, and the name and dose of the medication prescribed. In the event that your call is answered  by a member of our staff, make sure to document and save the date, time, and the name of the person that took your information.  2. Exception #2 (Planned Surgery): In the event that you are scheduled by another doctor or dentist to have any type of surgery or procedure, you are allowed (for a period no longer than 30 days), to receive additional pain medication, for the acute post-op pain. However, in this case, you are responsible for picking up a copy of our "Post-op Pain Management for Surgeons" handout, and giving it to your surgeon or dentist. This document is available at our office, and does not require an appointment to obtain it. Simply go to our office during business hours (Monday-Thursday from 8:00 AM to 4:00 PM) (Friday 8:00 AM to 12:00 Noon) or if you have a scheduled appointment with [place], prior to your surgery, and ask for it by name. In addition, you will need to provide [place] with your name, name of your surgeon, type of surgery, and date of procedure or surgery.  *Opioid medications include: morphine, codeine, oxycodone, oxymorphone, hydrocodone, hydromorphone, meperidine, tramadol, tapentadol, buprenorphine, fentanyl, methadone. **Benzodiazepine medications include: diazepam (Valium), alprazolam (Xanax), clonazepam (Klonopine), lorazepam (Ativan), clorazepate (Tranxene), chlordiazepoxide (Librium), estazolam (Prosom), oxazepam (Serax), temazepam (Restoril), triazolam (Halcion) (Last updated: 11/08/2017) __________________________________________________________________

## 2018-03-18 NOTE — Progress Notes (Signed)
Nursing Pain Medication Assessment:  Safety precautions to be maintained throughout the outpatient stay will include: orient to surroundings, keep bed in low position, maintain call bell within reach at all times, provide assistance with transfer out of bed and ambulation.  Medication Inspection Compliance: Pill count conducted under aseptic conditions, in front of the patient. Neither the pills nor the bottle was removed from the patient's sight at any time. Once count was completed pills were immediately returned to the patient in their original bottle.  Medication: Tramadol (Ultram) Pill/Patch Count: 277 of 240 pills remain Pill/Patch Appearance: Markings consistent with prescribed medication Bottle Appearance: Standard pharmacy container. Clearly labeled. Filled Date: 06 / 30 / 2019 Last Medication intake:  Today

## 2018-03-18 NOTE — Patient Instructions (Addendum)
____________________________________________________________________________________________  Medication Rules  Applies to: All patients receiving prescriptions (written or electronic).  Pharmacy of record: Pharmacy where electronic prescriptions will be sent. If written prescriptions are taken to a different pharmacy, please inform the nursing staff. The pharmacy listed in the electronic medical record should be the one where you would like electronic prescriptions to be sent.  Prescription refills: Only during scheduled appointments. Applies to both, written and electronic prescriptions.  NOTE: The following applies primarily to controlled substances (Opioid* Pain Medications).   Patient's responsibilities: 1. Pain Pills: Bring all pain pills to every appointment (except for procedure appointments). 2. Pill Bottles: Bring pills in original pharmacy bottle. Always bring newest bottle. Bring bottle, even if empty. 3. Medication refills: You are responsible for knowing and keeping track of what medications you need refilled. The day before your appointment, write a list of all prescriptions that need to be refilled. Bring that list to your appointment and give it to the admitting nurse. Prescriptions will be written only during appointments. If you forget a medication, it will not be "Called in", "Faxed", or "electronically sent". You will need to get another appointment to get these prescribed. 4. Prescription Accuracy: You are responsible for carefully inspecting your prescriptions before leaving our office. Have the discharge nurse carefully go over each prescription with you, before taking them home. Make sure that your name is accurately spelled, that your address is correct. Check the name and dose of your medication to make sure it is accurate. Check the number of pills, and the written instructions to make sure they are clear and accurate. Make sure that you are given enough medication to last  until your next medication refill appointment. 5. Taking Medication: Take medication as prescribed. Never take more pills than instructed. Never take medication more frequently than prescribed. Taking less pills or less frequently is permitted and encouraged, when it comes to controlled substances (written prescriptions).  6. Inform other Doctors: Always inform, all of your healthcare providers, of all the medications you take. 7. Pain Medication from other Providers: You are not allowed to accept any additional pain medication from any other Doctor or Healthcare provider. There are two exceptions to this rule. (see below) In the event that you require additional pain medication, you are responsible for notifying us, as stated below. 8. Medication Agreement: You are responsible for carefully reading and following our Medication Agreement. This must be signed before receiving any prescriptions from our practice. Safely store a copy of your signed Agreement. Violations to the Agreement will result in no further prescriptions. (Additional copies of our Medication Agreement are available upon request.) 9. Laws, Rules, & Regulations: All patients are expected to follow all Federal and State Laws, Statutes, Rules, & Regulations. Ignorance of the Laws does not constitute a valid excuse. The use of any illegal substances is prohibited. 10. Adopted CDC guidelines & recommendations: Target dosing levels will be at or below 60 MME/day. Use of benzodiazepines** is not recommended.  Exceptions: There are only two exceptions to the rule of not receiving pain medications from other Healthcare Providers. 1. Exception #1 (Emergencies): In the event of an emergency (i.e.: accident requiring emergency care), you are allowed to receive additional pain medication. However, you are responsible for: As soon as you are able, call our office [phone], at any time of the day or night, and leave a message stating your name, the  date and nature of the emergency, and the name and dose of the medication   prescribed. In the event that your call is answered by a member of our staff, make sure to document and save the date, time, and the name of the person that took your information.  2. Exception #2 (Planned Surgery): In the event that you are scheduled by another doctor or dentist to have any type of surgery or procedure, you are allowed (for a period no longer than 30 days), to receive additional pain medication, for the acute post-op pain. However, in this case, you are responsible for picking up a copy of our "Post-op Pain Management for Surgeons" handout, and giving it to your surgeon or dentist. This document is available at our office, and does not require an appointment to obtain it. Simply go to our office during business hours (Monday-Thursday from 8:00 AM to 4:00 PM) (Friday 8:00 AM to 12:00 Noon) or if you have a scheduled appointment with us, prior to your surgery, and ask for it by name. In addition, you will need to provide us with your name, name of your surgeon, type of surgery, and date of procedure or surgery.  *Opioid medications include: morphine, codeine, oxycodone, oxymorphone, hydrocodone, hydromorphone, meperidine, tramadol, tapentadol, buprenorphine, fentanyl, methadone. **Benzodiazepine medications include: diazepam (Valium), alprazolam (Xanax), clonazepam (Klonopine), lorazepam (Ativan), clorazepate (Tranxene), chlordiazepoxide (Librium), estazolam (Prosom), oxazepam (Serax), temazepam (Restoril), triazolam (Halcion) (Last updated: 11/08/2017) ____________________________________________________________________________________________    

## 2018-03-23 LAB — TOXASSURE SELECT 13 (MW), URINE

## 2018-04-09 ENCOUNTER — Other Ambulatory Visit: Payer: Self-pay | Admitting: Nurse Practitioner

## 2018-04-09 DIAGNOSIS — G894 Chronic pain syndrome: Secondary | ICD-10-CM

## 2018-07-24 ENCOUNTER — Ambulatory Visit: Payer: Self-pay | Admitting: Adult Health

## 2018-07-31 ENCOUNTER — Encounter: Payer: Self-pay | Admitting: Adult Health

## 2018-07-31 ENCOUNTER — Ambulatory Visit: Payer: Self-pay | Admitting: Adult Health

## 2018-07-31 VITALS — BP 136/90 | HR 104 | Temp 98.0°F | Resp 16 | Ht 67.0 in | Wt 204.0 lb

## 2018-07-31 DIAGNOSIS — R300 Dysuria: Secondary | ICD-10-CM

## 2018-07-31 DIAGNOSIS — M62838 Other muscle spasm: Secondary | ICD-10-CM

## 2018-07-31 DIAGNOSIS — I10 Essential (primary) hypertension: Secondary | ICD-10-CM

## 2018-07-31 DIAGNOSIS — G8929 Other chronic pain: Secondary | ICD-10-CM

## 2018-07-31 DIAGNOSIS — M5442 Lumbago with sciatica, left side: Secondary | ICD-10-CM

## 2018-07-31 DIAGNOSIS — M5441 Lumbago with sciatica, right side: Secondary | ICD-10-CM

## 2018-07-31 LAB — POCT URINALYSIS DIPSTICK
Bilirubin, UA: NEGATIVE
Blood, UA: NEGATIVE
GLUCOSE UA: NEGATIVE
Ketones, UA: NEGATIVE
Leukocytes, UA: NEGATIVE
Nitrite, UA: NEGATIVE
Protein, UA: NEGATIVE
Spec Grav, UA: 1.01 (ref 1.010–1.025)
Urobilinogen, UA: 0.2 E.U./dL
pH, UA: 6 (ref 5.0–8.0)

## 2018-07-31 MED ORDER — AMLODIPINE BESYLATE 10 MG PO TABS: 10.0000 mg | ORAL_TABLET | Freq: Every day | ORAL | 5 refills | Status: DC

## 2018-07-31 MED ORDER — DIAZEPAM 5 MG PO TABS: 5.0000 mg | ORAL_TABLET | Freq: Every evening | ORAL | 1 refills | Status: DC | PRN

## 2018-07-31 NOTE — Progress Notes (Signed)
[facility] [place], [place] [address]  Internal MEDICINE  Office Visit Note  Patient Name: Shawna Carrillo  [id number]  [id number]  Date of Service: 07/31/2018  Chief Complaint  Patient presents with  . Back Pain    getting worse     HPI Patient presents today for ongoing follow-up of her chronic back pain.  She reports that her back pain is getting worse.  She is currently seeing pain management however what they are doing does not appear to be helping her.  She has had significant injury to her lumbar spine with surgical correction.  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  Patient reports she cannot stand up for more than 3 to 5 minutes before her leg is going to give way and the pain is so intense she has to sit down.  She is currently working with [employer] and trying to get disability so that she may stop working.  She is requesting a refill of her Valium for this chronic muscle spasm with her back pain as well as her amlodipine for hypertension.  She expressed to me the pain clinic is aware that we provide her with Valium and she is not no risk of breaking her contract with pain management by continuing to receive it from [place].  She reports only needing the Valium rarely, and denies any other issues at this time.     Current Medication: Outpatient Encounter Medications as of 07/31/2018  Medication Sig  . amLODipine (NORVASC) 10 MG tablet Take 1 tablet (10 mg total) by mouth daily.  . diazepam (VALIUM) 5 MG tablet Take 1 tablet (5 mg total) by mouth at bedtime as needed for anxiety or muscle spasms.  . DULoxetine (CYMBALTA) 60 MG capsule Take 1 capsule (60 mg total) by mouth daily.  Marland Kitchen ibuprofen (ADVIL,MOTRIN) 200 MG tablet Take 800 mg by mouth as needed for mild pain.  . traMADol (ULTRAM) 50 MG tablet Take 2 tablets (100 mg total) by mouth every 6 (six) hours as needed.  .  [DISCONTINUED] amLODipine (NORVASC) 10 MG tablet Take by mouth.  . [DISCONTINUED] diazepam (VALIUM) 5 MG tablet Take 1 tablet (5 mg total) by mouth at bedtime as needed for anxiety or muscle spasms.   No facility-administered encounter medications on file as of 07/31/2018.     Surgical History: Past Surgical History:  Procedure Laterality Date  . hilar myectomy    . LUMBAR FUSION  2012   FAILED    Medical History: Past Medical History:  Diagnosis Date  . B12 deficiency   . History of anemia 07/12/2015  . History of migraine 07/12/2015  . History of peptic ulcer disease 07/12/2015  . Insomnia     Family History: Family History  Problem Relation Age of Onset  . Diabetes Mother   . Heart disease Father     Social History   Socioeconomic History  . Marital status: Married    Spouse name: Not on file  . Number of children: Not on file  . Years of education: Not on file  . Highest education level: Not on file  Occupational History  . Not on file  Social Needs  . Financial resource strain: Not on file  . Food insecurity:    Worry: Not on file    Inability: Not on file  . Transportation needs:    Medical: Not on file  Non-medical: Not on file  Tobacco Use  . Smoking status: Current Every Day Smoker    Packs/day: 1.00    Types: Cigarettes  . Smokeless tobacco: Never Used  Substance and Sexual Activity  . Alcohol use: No    Alcohol/week: 0.0 standard drinks  . Drug use: No  . Sexual activity: Not on file  Lifestyle  . Physical activity:    Days per week: Not on file    Minutes per session: Not on file  . Stress: Not on file  Relationships  . Social connections:    Talks on phone: Not on file    Gets together: Not on file    Attends religious service: Not on file    Active member of club or organization: Not on file    Attends meetings of clubs or organizations: Not on file    Relationship status: Not on file  . Intimate partner violence:    Fear of  current or ex partner: Not on file    Emotionally abused: Not on file    Physically abused: Not on file    Forced sexual activity: Not on file  Other Topics Concern  . Not on file  Social History Narrative  . Not on file      Review of Systems  Constitutional: Negative for chills, fatigue and unexpected weight change.  HENT: Negative for congestion, rhinorrhea, sneezing and sore throat.   Eyes: Negative for photophobia, pain and redness.  Respiratory: Negative for cough, chest tightness and shortness of breath.   Cardiovascular: Negative for chest pain and palpitations.  Gastrointestinal: Negative for abdominal pain, constipation, diarrhea, nausea and vomiting.  Endocrine: Negative.   Genitourinary: Negative for dysuria and frequency.  Musculoskeletal: Positive for back pain. Negative for arthralgias, joint swelling and neck pain.  Skin: Negative for rash.  Allergic/Immunologic: Negative.   Neurological: Negative for tremors and numbness.       Left thigh parasthesia  Hematological: Negative for adenopathy. Does not bruise/bleed easily.  Psychiatric/Behavioral: Negative for behavioral problems and sleep disturbance. The patient is not nervous/anxious.     Vital Signs: BP 136/90 (BP Location: Left Arm, Patient Position: Sitting, Cuff Size: Normal)   Pulse (!) 104   Temp 98 F (36.7 C)   Resp 16   Ht 5\' 7"  (1.702 m)   Wt 204 lb (92.5 kg)   SpO2 99%   BMI 31.95 kg/m    Physical Exam  Constitutional: She is oriented to person, place, and time. She appears well-developed and well-nourished. No distress.  HENT:  Head: Normocephalic and atraumatic.  Mouth/Throat: Oropharynx is clear and moist. No oropharyngeal exudate.  Eyes: Pupils are equal, round, and reactive to light. EOM are normal.  Neck: Normal range of motion. Neck supple. No JVD present. No tracheal deviation present. No thyromegaly present.  Cardiovascular: Normal rate, regular rhythm and normal heart sounds.  Exam reveals no gallop and no friction rub.  No murmur heard. Pulmonary/Chest: Effort normal and breath sounds normal. No respiratory distress. She has no wheezes. She has no rales. She exhibits no tenderness.  Abdominal: Soft. There is no tenderness. There is no guarding.  Musculoskeletal: Normal range of motion.  Lymphadenopathy:    She has no cervical adenopathy.  Neurological: She is alert and oriented to person, place, and time. No cranial nerve deficit.  Skin: Skin is warm and dry. She is not diaphoretic.  Psychiatric: She has a normal mood and affect. Her behavior is normal. Judgment and thought content normal.  Nursing note and vitals reviewed.  Assessment/Plan: 1. Chronic bilateral low back pain with bilateral sciatica (P) (B) (R>L) Patient continues to struggle with low back pain.  Patient should continue to see pain management.  Will let us know if there is anything further she needs.  2. Muscle spasm Prescription for Valium supplied for patient's muscle spasms. - diazepam (VALIUM) 5 MG tablet; Take 1 tablet (5 mg total) by mouth at bedtime as needed for anxiety or muscle spasms.  Dispense: 30 tablet; Refill: 1  3. Essential hypertension Patient's blood pressure is well controlled.  Refilled patient's Norvasc prescription as requested. - amLODipine (NORVASC) 10 MG tablet; Take 1 tablet (10 mg total) by mouth daily.  Dispense: 30 tablet; Refill: 5  4. Dysuria Patient's urine dip was clear in office. - POCT Urinalysis Dipstick  General Counseling: Tavon verbalizes understanding of the findings of todays visit and agrees with plan of treatment. I have discussed any further diagnostic evaluation that may be needed or ordered today. We also reviewed her medications today. she has been encouraged to call the office with any questions or concerns that should arise related to todays visit.    Orders Placed This Encounter  Procedures  . POCT Urinalysis Dipstick    Meds ordered  this encounter  Medications  . amLODipine (NORVASC) 10 MG tablet    Sig: Take 1 tablet (10 mg total) by mouth daily.    Dispense:  30 tablet    Refill:  5  . diazepam (VALIUM) 5 MG tablet    Sig: Take 1 tablet (5 mg total) by mouth at bedtime as needed for anxiety or muscle spasms.    Dispense:  30 tablet    Refill:  1    Time spent: 25 Minutes   This patient was seen by Orson Gear AGNP-C in Collaboration with Dr Lavera Guise as a part of collaborative care agreement     Kendell Bane AGNP-C Internal medicine

## 2018-07-31 NOTE — Patient Instructions (Signed)
Lumbosacral Radiculopathy Lumbosacral radiculopathy is a condition that involves the spinal nerves and nerve roots in the low back and bottom of the spine. The condition develops when these nerves and nerve roots move out of place or become inflamed and cause symptoms. What are the causes? This condition may be caused by:  Pressure from a disk that bulges out of place (herniated disk). A disk is a plate of cartilage that separates bones in the spine.  Disk degeneration.  A narrowing of the bones of the lower back (spinal stenosis).  A tumor.  An infection.  An injury that places sudden pressure on the disks that cushion the bones of your lower spine.  What increases the risk? This condition is more likely to develop in:  Males aged 59-50 years.  Females aged 92-60 years.  People who lift improperly.  People who are overweight or live a sedentary lifestyle.  People who smoke.  People who perform repetitive activities that strain the spine.  What are the signs or symptoms? Symptoms of this condition include:  Pain that goes down from the back into the legs (sciatica). This is the most common symptom. The pain may be worse with sitting, coughing, or sneezing.  Pain and numbness in the arms and legs.  Muscle weakness.  Tingling.  Loss of bladder control or bowel control.  How is this diagnosed? This condition is diagnosed with a physical exam and medical history. If the pain is lasting, you may have tests, such as:  MRI scan.  X-ray.  CT scan.  Myelogram.  Nerve conduction study.  How is this treated? This condition is often treated with:  Hot packs and ice applied to affected areas.  Stretches to improve flexibility.  Exercises to strengthen back muscles.  Physical therapy.  Pain medicine.  A steroid injection in the spine.  In some cases, no treatment is needed. If the condition is long-lasting (chronic), or if symptoms are severe, treatment may  involve surgery or lifestyle changes, such as following a weight loss plan. Follow these instructions at home: Medicines  Take medicines only as directed by your health care provider.  Do not drive or operate heavy machinery while taking pain medicine. Injury care  Apply a heat pack to the injured area as directed by your health care provider.  Apply ice to the affected area: ? Put ice in a plastic bag. ? Place a towel between your skin and the bag. ? Leave the ice on for 20-30 minutes, every 2 hours while you are awake or as needed. Or, leave the ice on for as long as directed by your health care provider. Other Instructions  If you were shown how to do any exercises or stretches, do them as directed by your health care provider.  If your health care provider prescribed a diet or exercise program, follow it as directed.  Keep all follow-up visits as directed by your health care provider. This is important. Contact a health care provider if:  Your pain does not improve over time even when taking pain medicines. Get help right away if:  Your develop severe pain.  Your pain suddenly gets worse.  You develop increasing weakness in your legs.  You lose the ability to control your bladder or bowel.  You have difficulty walking or balancing.  You have a fever. This information is not intended to replace advice given to you by your health care provider. Make sure you discuss any questions you have with your  health care provider. Document Released: 08/28/2005 Document Revised: 02/03/2016 Document Reviewed: 08/24/2014 Elsevier Interactive Patient Education  [employer].

## 2018-08-11 ENCOUNTER — Other Ambulatory Visit: Payer: Self-pay | Admitting: Nurse Practitioner

## 2018-08-11 DIAGNOSIS — G894 Chronic pain syndrome: Secondary | ICD-10-CM

## 2018-08-14 ENCOUNTER — Telehealth: Payer: Self-pay | Admitting: Nurse Practitioner

## 2018-08-14 NOTE — Telephone Encounter (Signed)
Adjustor  [place] [phone]  Ext. [phone]

## 2018-08-14 NOTE — Telephone Encounter (Signed)
Pt states that she needs a letter of necessity for her insurance to pay to get her medication filled. She is on [facility]

## 2018-08-14 NOTE — Telephone Encounter (Signed)
Shawna Carrillo does not know exactly what documentation they need or where to send it. Called adjustor to get this information, message left.

## 2018-08-15 ENCOUNTER — Telehealth: Payer: Self-pay | Admitting: Nurse Practitioner

## 2018-08-15 NOTE — Telephone Encounter (Signed)
Shawna Carrillo has contacted her attorney, no further action needed by our clinic.

## 2018-08-15 NOTE — Telephone Encounter (Signed)
Pt called and said that she still hasn't gotten approval for her medication and she is completely out. She wants someone to call her back and let her know what is going on. She uses [employer] in [place].

## 2018-08-19 ENCOUNTER — Telehealth: Payer: Self-pay | Admitting: Nurse Practitioner

## 2018-08-19 NOTE — Telephone Encounter (Signed)
Patient notified we have not received a fax from her attorney.

## 2018-08-19 NOTE — Telephone Encounter (Signed)
Pt called and wanted to know if we had received a fax from her lawyer about the medication that she needs and the medical necessity paperwork?

## 2018-08-22 ENCOUNTER — Telehealth: Payer: Self-pay | Admitting: *Deleted

## 2018-08-22 NOTE — Telephone Encounter (Signed)
Notes faxed to attorney again.

## 2018-09-18 ENCOUNTER — Ambulatory Visit: Payer: Worker's Compensation | Attending: Nurse Practitioner | Admitting: Nurse Practitioner

## 2018-09-18 ENCOUNTER — Encounter: Payer: Self-pay | Admitting: Nurse Practitioner

## 2018-09-18 ENCOUNTER — Other Ambulatory Visit: Payer: Self-pay

## 2018-09-18 VITALS — BP 139/91 | HR 101 | Temp 98.4°F | Resp 18 | Ht 67.0 in | Wt 200.0 lb

## 2018-09-18 DIAGNOSIS — R030 Elevated blood-pressure reading, without diagnosis of hypertension: Secondary | ICD-10-CM | POA: Insufficient documentation

## 2018-09-18 DIAGNOSIS — G894 Chronic pain syndrome: Secondary | ICD-10-CM | POA: Diagnosis not present

## 2018-09-18 DIAGNOSIS — M47816 Spondylosis without myelopathy or radiculopathy, lumbar region: Secondary | ICD-10-CM | POA: Diagnosis not present

## 2018-09-18 DIAGNOSIS — Z79891 Long term (current) use of opiate analgesic: Secondary | ICD-10-CM | POA: Insufficient documentation

## 2018-09-18 DIAGNOSIS — Z720 Tobacco use: Secondary | ICD-10-CM | POA: Insufficient documentation

## 2018-09-18 DIAGNOSIS — M5441 Lumbago with sciatica, right side: Secondary | ICD-10-CM

## 2018-09-18 DIAGNOSIS — M79605 Pain in left leg: Secondary | ICD-10-CM | POA: Diagnosis present

## 2018-09-18 DIAGNOSIS — G8929 Other chronic pain: Secondary | ICD-10-CM | POA: Diagnosis present

## 2018-09-18 DIAGNOSIS — I00 Rheumatic fever without heart involvement: Secondary | ICD-10-CM | POA: Insufficient documentation

## 2018-09-18 DIAGNOSIS — F17200 Nicotine dependence, unspecified, uncomplicated: Secondary | ICD-10-CM | POA: Insufficient documentation

## 2018-09-18 DIAGNOSIS — M5442 Lumbago with sciatica, left side: Secondary | ICD-10-CM | POA: Insufficient documentation

## 2018-09-18 MED ORDER — TRAMADOL HCL 50 MG PO TABS: 100.0000 mg | ORAL_TABLET | Freq: Four times a day (QID) | ORAL | 5 refills | Status: DC | PRN

## 2018-09-18 MED ORDER — DULOXETINE HCL 60 MG PO CPEP: 60.0000 mg | ORAL_CAPSULE | Freq: Every day | ORAL | 1 refills | Status: DC

## 2018-09-18 NOTE — Progress Notes (Signed)
Patient's Name: Shawna Carrillo  MRN: [id number]  Referring Provider: Lavera Guise, MD  DOB: 12-19-1960  PCP: Lavera Guise, MD  DOS: 09/18/2018  Note by: Vevelyn Francois NP  Service setting: Ambulatory outpatient  Specialty: Interventional Pain Management  Location: [facility] (AMB) Pain Management Facility    Patient type: Established    Primary Reason(s) for Visit: Encounter for prescription drug management. (Level of risk: moderate)  CC: Back Pain (left, lower)  HPI  Shawna Carrillo is a 58 y.o. year old, female patient, who comes today for a medication management evaluation. She has Chronic bilateral low back pain with bilateral sciatica (P) (B) (R>L); Nocturnal leg cramps; Chronic insomnia; Elevated platelet count; Achalasia; Chronic pain; Long term current use of opiate analgesic; Long term prescription opiate use; Opiate use (40 MME/Day); Encounter for therapeutic drug level monitoring; Chronic leg pain (R) (L5 Big toe); Chronic back pain; Lumbar spondylosis; Lumbar facet syndrome (B) (R>L); History of migraine; Hyperlipidemia; GERD (gastroesophageal reflux disease); History of peptic ulcer disease; History of anemia; Muscle spasm; Cigarette smoker; Elevated blood-pressure reading without diagnosis of hypertension; Rheumatic fever; Tobacco use; and Chronic pain of left lower extremity(L 2-3, L3-4)  on their problem list. Her primarily concern today is the Back Pain (left, lower)  Pain Assessment: Location: Left, Lower Back Radiating: numbness in left thigh Onset: More than a month ago Duration: Chronic pain Quality: Burning, Constant, Aching, Sharp Severity: 6 /10 (subjective, self-reported pain score)  Note: Reported level is compatible with observation. Clinically the patient looks like a 2/10 A 2/10 is viewed as "Mild to Moderate" and described as noticeable and distracting. Impossible to hide from other people. More frequent flare-ups. Still possible to adapt and function close to normal. It can  be very annoying and may have occasional stronger flare-ups. With discipline, patients may get used to it and adapt. Information on the proper use of the pain scale provided to the patient today. When using our objective Pain Scale, levels between 6 and 10/10 are said to belong in an emergency room, as it progressively worsens from a 6/10, described as severely limiting, requiring emergency care not usually available at an outpatient pain management facility. At a 6/10 level, communication becomes difficult and requires great effort. Assistance to reach the emergency department may be required. Facial flushing and profuse sweating along with potentially dangerous increases in heart rate and blood pressure will be evident. Effect on ADL:   Timing: Constant Modifying factors: sitting BP: (!) 139/91  HR: (!) 101  Shawna Carrillo was last scheduled for an appointment on 08/19/2018 for medication management. During today's appointment we reviewed Shawna Carrillo's chronic pain status, as well as her outpatient medication regimen. She admits that she is having increased pain on her left side. She is having pain in the left thigh. She is having numbness with standing more than 3 minutes. She admits that it goes numb and then there is a cold feeling.  She did have an MRI February 2019 [facility].  The patient  reports no history of drug use. Her body mass index is 31.32 kg/m.  Further details on both, my assessment(s), as well as the proposed treatment plan, please see below.  Controlled Substance Pharmacotherapy Assessment REMS (Risk Evaluation and Mitigation Strategy)  Analgesic:Tramadol 100 mg every 6 hours (400 mg/day of tramadol) MME/day:40 mg/day  Shawna Martins, RN  09/18/2018  1:12 PM  Sign when Signing Visit Nursing Pain Medication Assessment:  Safety precautions to be maintained throughout the  outpatient stay will include: orient to surroundings, keep bed in low position, maintain call bell  within reach at all times, provide assistance with transfer out of bed and ambulation.  Medication Inspection Compliance: Pill count conducted under aseptic conditions, in front of the patient. Neither the pills nor the bottle was removed from the patient's sight at any time. Once count was completed pills were immediately returned to the patient in their original bottle.  Medication: Tramadol (Ultram) Pill/Patch Count: 4 of 240 pills remain Pill/Patch Appearance: Markings consistent with prescribed medication Bottle Appearance: Standard pharmacy container. Clearly labeled. Filled Date: 12/06/ 2019 Last Medication intake:  Today   Pharmacokinetics: Liberation and absorption (onset of action): WNL Distribution (time to peak effect): WNL Metabolism and excretion (duration of action): WNL         Pharmacodynamics: Desired effects: Analgesia: Shawna Carrillo reports >50% benefit. Functional ability: Patient reports that medication allows her to accomplish basic ADLs Clinically meaningful improvement in function (CMIF): Sustained CMIF goals met Perceived effectiveness: Described as relatively effective, allowing for increase in activities of daily living (ADL) Undesirable effects: Side-effects or Adverse reactions: None reported Monitoring: [place] PMP: Online review of the past 42-monthperiod conducted. Compliant with practice rules and regulations Last UDS on record: Summary  Date Value Ref Range Status  03/18/2018 FINAL  Final    Comment:    ==================================================================== TOXASSURE SELECT 13 (MW) ==================================================================== Test                             Result       Flag       Units Drug Present and Declared for Prescription Verification   Tramadol                       >>[id number]      EXPECTED   ng/mg creat   O-Desmethyltramadol            11625        EXPECTED   ng/mg creat   N-Desmethyltramadol             7781         EXPECTED   ng/mg creat    Source of tramadol is a prescription medication.    O-desmethyltramadol and N-desmethyltramadol are expected    metabolites of tramadol. Drug Absent but Declared for Prescription Verification   Diazepam                       Not Detected UNEXPECTED ng/mg creat ==================================================================== Test                      Result    Flag   Units      Ref Range   Creatinine              36               mg/dL      >=20 ==================================================================== Declared Medications:  The flagging and interpretation on this report are based on the  following declared medications.  Unexpected results may arise from  inaccuracies in the declared medications.  **Note: The testing scope of this panel includes these medications:  Diazepam (Valium)  Tramadol  **Note: The testing scope of this panel does not include following  reported medications:  Duloxetine (Cymbalta)  Ibuprofen ==================================================================== For clinical consultation, please call [phone] ====================================================================  UDS interpretation: Compliant Patient informed of the CDC guidelines and recommendations to stay away from the concomitant use of benzodiazepines and opioids due to the increased risk of respiratory depression and death. Medication Assessment Form: Reviewed. Patient indicates being compliant with therapy Treatment compliance: Compliant Risk Assessment Profile: Aberrant behavior: See prior evaluations. None observed or detected today Comorbid factors increasing risk of overdose: See prior notes. No additional risks detected today Opioid risk tool (ORT) (Total Score): 3 Personal History of Substance Abuse (SUD-Substance use disorder):  Alcohol: Negative  Illegal Drugs: Negative  Rx Drugs: Negative  ORT Risk Level calculation:  Low Risk Risk of substance use disorder (SUD): Low Opioid Risk Tool - 09/18/18 1311      Family History of Substance Abuse   Alcohol  Positive Female    Illegal Drugs  Negative    Rx Drugs  Negative      Personal History of Substance Abuse   Alcohol  Negative    Illegal Drugs  Negative    Rx Drugs  Negative      Age   Age between 21-45 years   No      Psychological Disease   Psychological Disease  Negative    Depression  Negative      Total Score   Opioid Risk Tool Scoring  3    Opioid Risk Interpretation  Low Risk      ORT Scoring interpretation table:  Score <3 = Low Risk for SUD  Score between 4-7 = Moderate Risk for SUD  Score >8 = High Risk for Opioid Abuse   Risk Mitigation Strategies:  Patient Counseling: Covered Patient-Prescriber Agreement (PPA): Present and active  Notification to other healthcare providers: Done  Pharmacologic Plan: No change in therapy, at this time.             Laboratory Chemistry  Inflammation Markers (CRP: Acute Phase) (ESR: Chronic Phase)                        Rheumatology Markers No results found for: RF, ANA, LABURIC, URICUR, LYMEIGGIGMAB, LYMEABIGMQN, HLAB27                      Renal Function Markers Chemistry completed with PCP on 04/2018 WNL  CNS Tests No results found for: COLORCSF, APPEARCSF, RBCCOUNTCSF, WBCCSF, POLYSCSF, LYMPHSCSF, EOSCSF, PROTEINCSF, GLUCCSF, JCVIRUS, CSFOLI, IGGCSF                      Bone Pathology Markers No results found for: VD25OH, DJ570VX7LTJ, QZ0092ZR0, QT6226JF3, 25OHVITD1, 25OHVITD2, 25OHVITD3, TESTOFREE, TESTOSTERONE                       Coagulation Parameters                       Cardiovascular Markers                        CA Markers No results found for: CEA, CA125, LABCA2                      Note: Lab results reviewed.  Recent Diagnostic Imaging Results  DG Shoulder Left Portable CLINICAL DATA:  Shoulder dislocation  EXAM: LEFT SHOULDER - 1 VIEW  COMPARISON:  Left  shoulder radiograph earlier the same day.  FINDINGS: Interval reduction of left glenohumeral dislocation. No fracture visualized.  IMPRESSION:  Reduction of left glenohumeral dislocation, now in anatomic alignment.  Electronically Signed   By: Ulyses Jarred M.D.   On: 10/31/2016 06:26 DG Shoulder Left CLINICAL DATA:  Shoulder dislocation  EXAM: LEFT SHOULDER - 2+ VIEW  COMPARISON:  None.  FINDINGS: There is anterior dislocation of the left glenohumeral joint. No fracture identified.  IMPRESSION: Anterior left glenohumeral dislocation.  Electronically Signed   By: Ulyses Jarred M.D.   On: 10/31/2016 06:00  Complexity Note: Imaging results reviewed. Results shared with Shawna Carrillo, using Layman's terms.                         Meds   Current Outpatient Medications:  .  amLODipine (NORVASC) 10 MG tablet, Take 1 tablet (10 mg total) by mouth daily., Disp: 30 tablet, Rfl: 5 .  diazepam (VALIUM) 5 MG tablet, Take 1 tablet (5 mg total) by mouth at bedtime as needed for anxiety or muscle spasms., Disp: 30 tablet, Rfl: 1 .  [START ON 09/24/2018] DULoxetine (CYMBALTA) 60 MG capsule, Take 1 capsule (60 mg total) by mouth daily., Disp: 90 capsule, Rfl: 1 .  ibuprofen (ADVIL,MOTRIN) 200 MG tablet, Take 800 mg by mouth as needed for mild pain., Disp: , Rfl:  .  [START ON 09/24/2018] traMADol (ULTRAM) 50 MG tablet, Take 2 tablets (100 mg total) by mouth every 6 (six) hours as needed., Disp: 240 tablet, Rfl: 5  ROS  Constitutional: Denies any fever or chills Gastrointestinal: No reported hemesis, hematochezia, vomiting, or acute GI distress Musculoskeletal: Denies any acute onset joint swelling, redness, loss of ROM, or weakness Neurological: No reported episodes of acute onset apraxia, aphasia, dysarthria, agnosia, amnesia, paralysis, loss of coordination, or loss of consciousness  Allergies  Shawna Carrillo is allergic to latex and codeine.  PFSH  Drug: Shawna Carrillo  reports no  history of drug use. Alcohol:  reports no history of alcohol use. Tobacco:  reports that she has been smoking cigarettes. She has been smoking about 1.00 pack per day. She has never used smokeless tobacco. Medical:  has a past medical history of B12 deficiency, History of anemia (07/12/2015), History of migraine (07/12/2015), History of peptic ulcer disease (07/12/2015), and Insomnia. Surgical: Shawna Carrillo  has a past surgical history that includes Lumbar fusion (2012) and hilar myectomy. Family: family history includes Diabetes in her mother; Heart disease in her father.  Constitutional Exam  General appearance: Well nourished, well developed, and well hydrated. In no apparent acute distress Vitals:   09/18/18 1306  BP: (!) 139/91  Pulse: (!) 101  Resp: 18  Temp: 98.4 F (36.9 C)  TempSrc: Oral  SpO2: 99%  Weight: 200 lb (90.7 kg)  Height: _0  (1.702 m)  Psych/Mental status: Alert, oriented x 3 (person, place, & time)       Eyes: PERLA Respiratory: No evidence of acute respiratory distress  Cervical Spine Area Exam  Skin & Axial Inspection: No masses, redness, edema, swelling, or associated skin lesions Alignment: Symmetrical Functional ROM: Unrestricted ROM      Stability: No instability detected Muscle Tone/Strength: Functionally intact. No obvious neuro-muscular anomalies detected. Sensory (Neurological): Unimpaired Palpation: No palpable anomalies              Upper Extremity (UE) Exam    Side: Right upper extremity  Side: Left upper extremity  Skin & Extremity Inspection: Skin color, temperature, and hair growth are WNL. No peripheral edema or cyanosis. No masses, redness, swelling, asymmetry, or associated  skin lesions. No contractures.  Skin & Extremity Inspection: Skin color, temperature, and hair growth are WNL. No peripheral edema or cyanosis. No masses, redness, swelling, asymmetry, or associated skin lesions. No contractures.  Functional ROM: Unrestricted ROM           Functional ROM: Unrestricted ROM          Muscle Tone/Strength: Functionally intact. No obvious neuro-muscular anomalies detected.  Muscle Tone/Strength: Functionally intact. No obvious neuro-muscular anomalies detected.  Sensory (Neurological): Unimpaired          Sensory (Neurological): Unimpaired          Palpation: No palpable anomalies              Palpation: No palpable anomalies                   Thoracic Spine Area Exam  Skin & Axial Inspection: No masses, redness, or swelling Alignment: Symmetrical Functional ROM: Unrestricted ROM Stability: No instability detected Muscle Tone/Strength: Functionally intact. No obvious neuro-muscular anomalies detected. Sensory (Neurological): Unimpaired Muscle strength & Tone: No palpable anomalies  Lumbar Spine Area Exam  Skin & Axial Inspection: No masses, redness, or swelling Alignment: Symmetrical Functional ROM: Unrestricted ROM       Stability: No instability detected Muscle Tone/Strength: Functionally intact. No obvious neuro-muscular anomalies detected. Sensory (Neurological): Unimpaired Palpation: Tender       Provocative Tests: Hyperextension/rotation test: Positive bilaterally for facet joint pain. Lumbar quadrant test (Kemp's test): deferred today       Lateral bending test: (-)       Patrick's Maneuver: (+) for left-sided S-I arthralgia              Gait & Posture Assessment  Ambulation: Unassisted Gait: Relatively normal for age and body habitus Posture: Antalgic   Lower Extremity Exam    Side: Right lower extremity  Side: Left lower extremity  Stability: No instability observed          Stability: No instability observed          Skin & Extremity Inspection: Skin color, temperature, and hair growth are WNL. No peripheral edema or cyanosis. No masses, redness, swelling, asymmetry, or associated skin lesions. No contractures.  Skin & Extremity Inspection: Skin color, temperature, and hair growth are WNL. No peripheral  edema or cyanosis. No masses, redness, swelling, asymmetry, or associated skin lesions. No contractures.  Functional ROM: Unrestricted ROM                  Functional ROM: Unrestricted ROM                  Muscle Tone/Strength: Functionally intact. No obvious neuro-muscular anomalies detected.  Muscle Tone/Strength: Functionally intact. No obvious neuro-muscular anomalies detected.  Sensory (Neurological): Unimpaired        Sensory (Neurological): Dermatomal pain pattern            Palpation: No palpable anomalies  Palpation: No palpable anomalies   Assessment  Primary Diagnosis & Pertinent Problem List: The primary encounter diagnosis was Lumbar spondylosis. 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  Status Diagnosis  Worsening Worsening Controlled 1. Lumbar spondylosis   2. 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 Chronic pain of left lower extremity(L 2-3, L3-4)   4. Chronic  pain syndrome   5. Long term prescription opiate use     Problems updated and reviewed during this visit: Problem  Chronic pain of left lower extremity(L 2-3, L3-4)   Chronic Back Pain  Elevated Blood-Pressure Reading Without Diagnosis of Hypertension  Rheumatic Fever  Tobacco Use   Plan of Care  Pharmacotherapy (Medications Ordered): Meds ordered this encounter  Medications  . DULoxetine (CYMBALTA) 60 MG capsule    Sig: Take 1 capsule (60 mg total) by mouth daily.    Dispense:  90 capsule    Refill:  1    Do not place this medication, or any other prescription from our practice, on "Automatic Refill". Patient may have prescription filled one day early if pharmacy is closed on scheduled refill date.    Order Specific Question:   Supervising Provider    Answer:   Milinda Pointer  [phone]  . traMADol (ULTRAM) 50 MG tablet    Sig: Take 2 tablets (100 mg total) by mouth every 6 (six) hours as needed.    Dispense:  240 tablet    Refill:  5    Do not place this medication, or any other prescription from our practice, on "Automatic Refill". Patient may have prescription filled one day early if pharmacy is closed on scheduled refill date.    Order Specific Question:   Supervising Provider    Answer:   Milinda Pointer [[id number]]   New Prescriptions   No medications on file   Medications administered today: Shawna Carrillo had no medications administered during this visit. Lab-work, procedure(s), and/or referral(s): Orders Placed This Encounter  Procedures  . LUMBAR FACET(MEDIAL BRANCH NERVE BLOCK) MBNB  . ToxASSURE Select 13 (MW), Urine   Imaging and/or referral(s): None  Interventional therapies: Planned, scheduled, and/or pending: Diagnostic left lumbar facet block    Considering: Diagnostic bilateral lumbar facet block  Diagnostic caudal epidural steroid injection + epidurogram for the lower extremity pain   Palliative PRN treatment(s): Palliativebilateral lumbar facet block  Palliativecaudal epidural steroid injection + epidurogram for the lower extremity pain.    Provider-requested follow-up: Return in about 6 months (around 03/19/2019) for MedMgmt.  Future Appointments  Date Time Provider [place]  03/17/2019  1:45 PM Vevelyn Francois, NP [facility] None   Primary Care Physician: Lavera Guise, MD Location: [facility] Outpatient Pain Management Facility Note by: Vevelyn Francois NP Date: 09/18/2018; Time: 3:47 PM  Pain Score Disclaimer: We use the NRS-11 scale. This is a self-reported, subjective measurement of pain severity with only modest accuracy. It is used primarily to identify changes within a particular patient. It must be understood that outpatient pain scales are significantly less accurate that those used for research, where  they can be applied under ideal controlled circumstances with minimal exposure to variables. In reality, the score is likely to be a combination of pain intensity and pain affect, where pain affect describes the degree of emotional arousal or changes in action readiness caused by the sensory experience of pain. Factors such as social and work situation, setting, emotional state, anxiety levels, expectation, and prior pain experience may influence pain perception and show large inter-individual differences that may also be affected by time variables.  Patient instructions provided during this appointment: Patient Instructions   ____________________________________________________________________________________________  Medication Rules  Purpose: To inform patients, and their family members, of our rules and regulations.  Applies to: All patients receiving prescriptions (written or electronic).  Pharmacy of record: Pharmacy where electronic prescriptions will be sent. If  written prescriptions are taken to a different pharmacy, please inform the nursing staff. The pharmacy listed in the electronic medical record should be the one where you would like electronic prescriptions to be sent.  Electronic prescriptions: In compliance with the [place] (STOP) Act of 2017 (Session Lanny Cramp [phone]), effective September 11, 2018, all controlled substances must be electronically prescribed. Calling prescriptions to the pharmacy will cease to exist.  Prescription refills: Only during scheduled appointments. Applies to all prescriptions.  NOTE: The following applies primarily to controlled substances (Opioid* Pain Medications).   Patient's responsibilities: 1. Pain Pills: Bring all pain pills to every appointment (except for procedure appointments). 2. Pill Bottles: Bring pills in original pharmacy bottle. Always bring the newest bottle. Bring bottle, even if  empty. 3. Medication refills: You are responsible for knowing and keeping track of what medications you take and those you need refilled. The day before your appointment: write a list of all prescriptions that need to be refilled. The day of the appointment: give the list to the admitting nurse. Prescriptions will be written only during appointments. If you forget a medication: it will not be "Called in", "Faxed", or "electronically sent". You will need to get another appointment to get these prescribed. No early refills. Do not call asking to have your prescription filled early. 4. Prescription Accuracy: You are responsible for carefully inspecting your prescriptions before leaving our office. Have the discharge nurse carefully go over each prescription with you, before taking them home. Make sure that your name is accurately spelled, that your address is correct. Check the name and dose of your medication to make sure it is accurate. Check the number of pills, and the written instructions to make sure they are clear and accurate. Make sure that you are given enough medication to last until your next medication refill appointment. 5. Taking Medication: Take medication as prescribed. When it comes to controlled substances, taking less pills or less frequently than prescribed is permitted and encouraged. Never take more pills than instructed. Never take medication more frequently than prescribed.  6. Inform other Doctors: Always inform, all of your healthcare providers, of all the medications you take. 7. Pain Medication from other Providers: You are not allowed to accept any additional pain medication from any other Doctor or Healthcare provider. There are two exceptions to this rule. (see below) In the event that you require additional pain medication, you are responsible for notifying us, as stated below. 8. Medication Agreement: You are responsible for carefully reading and following our Medication  Agreement. This must be signed before receiving any prescriptions from our practice. Safely store a copy of your signed Agreement. Violations to the Agreement will result in no further prescriptions. (Additional copies of our Medication Agreement are available upon request.) 9. Laws, Rules, & Regulations: All patients are expected to follow all Federal and [employer], Rules, [employer]. Ignorance of the Laws does not constitute a valid excuse. The use of any illegal substances is prohibited. 10. Adopted CDC guidelines & recommendations: Target dosing levels will be at or below 60 MME/day. Use of benzodiazepines** is not recommended.  Exceptions: There are only two exceptions to the rule of not receiving pain medications from other Healthcare Providers. 1. Exception #1 (Emergencies): In the event of an emergency (i.e.: accident requiring emergency care), you are allowed to receive additional pain medication. However, you are responsible for: As soon as you are able, call our office [phone], at any  time of the day or night, and leave a message stating your name, the date and nature of the emergency, and the name and dose of the medication prescribed. In the event that your call is answered by a member of our staff, make sure to document and save the date, time, and the name of the person that took your information.  2. Exception #2 (Planned Surgery): In the event that you are scheduled by another doctor or dentist to have any type of surgery or procedure, you are allowed (for a period no longer than 30 days), to receive additional pain medication, for the acute post-op pain. However, in this case, you are responsible for picking up a copy of our "Post-op Pain Management for Surgeons" handout, and giving it to your surgeon or dentist. This document is available at our office, and does not require an appointment to obtain it. Simply go to our office during business hours (Monday-Thursday from  8:00 AM to 4:00 PM) (Friday 8:00 AM to 12:00 Noon) or if you have a scheduled appointment with [place], prior to your surgery, and ask for it by name. In addition, you will need to provide [place] with your name, name of your surgeon, type of surgery, and date of procedure or surgery.  *Opioid medications include: morphine, codeine, oxycodone, oxymorphone, hydrocodone, hydromorphone, meperidine, tramadol, tapentadol, buprenorphine, fentanyl, methadone. **Benzodiazepine medications include: diazepam (Valium), alprazolam (Xanax), clonazepam (Klonopine), lorazepam (Ativan), clorazepate (Tranxene), chlordiazepoxide (Librium), estazolam (Prosom), oxazepam (Serax), temazepam (Restoril), triazolam (Halcion) (Last updated: 11/08/2017) ____________________________________________________________________________________________    ______________________________________________________________________________________________  Specialty Pain Scale  Introduction:  There are significant differences in how pain is reported. The word pain usually refers to physical pain, but it is also a common synonym of suffering. The medical community uses a scale from 0 (zero) to 10 (ten) to report pain level. Zero (0) is described as "no pain", while ten (10) is described as "the worse pain you can imagine". The problem with this scale is that physical pain is reported along with suffering. Suffering refers to mental pain, or more often yet it refers to any unpleasant feeling, emotion or aversion associated with the perception of harm or threat of harm. It is the psychological component of pain.  Pain Specialists prefer to separate the two components. The pain scale used by this practice is the Verbal Numerical Rating Scale (VNRS-11). This scale is for the physical pain only. DO NOT INCLUDE how your pain psychologically affects you. This scale is for adults 43 years of age and older. It has 11 (eleven) levels. The 1st level is 0/10. This  means: "right now, I have no pain". In the context of pain management, it also means: "right now, my physical pain is under control with the current therapy".  General Information:  The scale should reflect your current level of pain. Unless you are specifically asked for the level of your worst pain, or your average pain. If you are asked for one of these two, then it should be understood that it is over the past 24 hours.  Levels 1 (one) through 5 (five) are described below, and can be treated as an outpatient. Ambulatory pain management facilities such as ours are more than adequate to treat these levels. Levels 6 (six) through 10 (ten) are also described below, however, these must be treated as a hospitalized patient. While levels 6 (six) and 7 (seven) may be evaluated at an urgent care facility, levels 8 (eight) through 10 (ten) constitute medical emergencies and  as such, they belong in a hospital's emergency department. When having these levels (as described below), do not come to our office. Our facility is not equipped to manage these levels. Go directly to an urgent care facility or an emergency department to be evaluated.  Definitions:  Activities of Daily Living (ADL): Activities of daily living (ADL or ADLs) is a term used in healthcare to refer to people's daily self-care activities. Health professionals often use a person's ability or inability to perform ADLs as a measurement of their functional status, particularly in regard to people post injury, with disabilities and the elderly. There are two ADL levels: Basic and Instrumental. Basic Activities of Daily Living (BADL  or BADLs) consist of self-care tasks that include: Bathing and showering; personal hygiene and grooming (including brushing/combing/styling hair); dressing; Toilet hygiene (getting to the toilet, cleaning oneself, and getting back up); eating and self-feeding (not including cooking or chewing and swallowing); functional  mobility, often referred to as "transferring", as measured by the ability to walk, get in and out of bed, and get into and out of a chair; the broader definition (moving from one place to another while performing activities) is useful for people with different physical abilities who are still able to get around independently. Basic ADLs include the things many people do when they get up in the morning and get ready to go out of the house: get out of bed, go to the toilet, bathe, dress, groom, and eat. On the average, loss of function typically follows a particular order. Hygiene is the first to go, followed by loss of toilet use and locomotion. The last to go is the ability to eat. When there is only one remaining area in which the person is independent, there is a 62.9% chance that it is eating and only a 3.5% chance that it is hygiene. Instrumental Activities of Daily Living (IADL or IADLs) are not necessary for fundamental functioning, but they let an individual live independently in a community. IADL consist of tasks that include: cleaning and maintaining the house; home establishment and maintenance; care of others (including selecting and supervising caregivers); care of pets; child rearing; managing money; managing financials (investments, etc.); meal preparation and cleanup; shopping for groceries and necessities; moving within the community; safety procedures and emergency responses; health management and maintenance (taking prescribed medications); and using the telephone or other form of communication.  Instructions:  Most patients tend to report their pain as a combination of two factors, their physical pain and their psychosocial pain. This last one is also known as "suffering" and it is reflection of how physical pain affects you socially and psychologically. From now on, report them separately.  From this point on, when asked to report your pain level, report only your physical pain. Use the  following table for reference.  Pain Clinic Pain Levels (0-5/10)  Pain Level Score  Description  No Pain 0   Mild pain 1 Nagging, annoying, but does not interfere with basic activities of daily living (ADL). Patients are able to eat, bathe, get dressed, toileting (being able to get on and off the toilet and perform personal hygiene functions), transfer (move in and out of bed or a chair without assistance), and maintain continence (able to control bladder and bowel functions). Blood pressure and heart rate are unaffected. A normal heart rate for a healthy adult ranges from 60 to 100 bpm (beats per minute).   Mild to moderate pain 2 Noticeable and distracting. Impossible to  hide from other people. More frequent flare-ups. Still possible to adapt and function close to normal. It can be very annoying and may have occasional stronger flare-ups. With discipline, patients may get used to it and adapt.   Moderate pain 3 Interferes significantly with activities of daily living (ADL). It becomes difficult to feed, bathe, get dressed, get on and off the toilet or to perform personal hygiene functions. Difficult to get in and out of bed or a chair without assistance. Very distracting. With effort, it can be ignored when deeply involved in activities.   Moderately severe pain 4 Impossible to ignore for more than a few minutes. With effort, patients may still be able to manage work or participate in some social activities. Very difficult to concentrate. Signs of autonomic nervous system discharge are evident: dilated pupils (mydriasis); mild sweating (diaphoresis); sleep interference. Heart rate becomes elevated (>115 bpm). Diastolic blood pressure (lower number) rises above 100 mmHg. Patients find relief in laying down and not moving.   Severe pain 5 Intense and extremely unpleasant. Associated with frowning face and frequent crying. Pain overwhelms the senses.  Ability to do any activity or maintain social  relationships becomes significantly limited. Conversation becomes difficult. Pacing back and forth is common, as getting into a comfortable position is nearly impossible. Pain wakes you up from deep sleep. Physical signs will be obvious: pupillary dilation; increased sweating; goosebumps; brisk reflexes; cold, clammy hands and feet; nausea, vomiting or dry heaves; loss of appetite; significant sleep disturbance with inability to fall asleep or to remain asleep. When persistent, significant weight loss is observed due to the complete loss of appetite and sleep deprivation.  Blood pressure and heart rate becomes significantly elevated. Caution: If elevated blood pressure triggers a pounding headache associated with blurred vision, then the patient should immediately seek attention at an urgent or emergency care unit, as these may be signs of an impending stroke.    Emergency Department Pain Levels (6-10/10)  Emergency Room Pain 6 Severely limiting. Requires emergency care and should not be seen or managed at an outpatient pain management facility. Communication becomes difficult and requires great effort. Assistance to reach the emergency department may be required. Facial flushing and profuse sweating along with potentially dangerous increases in heart rate and blood pressure will be evident.   Distressing pain 7 Self-care is very difficult. Assistance is required to transport, or use restroom. Assistance to reach the emergency department will be required. Tasks requiring coordination, such as bathing and getting dressed become very difficult.   Disabling pain 8 Self-care is no longer possible. At this level, pain is disabling. The individual is unable to do even the most "basic" activities such as walking, eating, bathing, dressing, transferring to a bed, or toileting. Fine motor skills are lost. It is difficult to think clearly.   Incapacitating pain 9 Pain becomes incapacitating. Thought processing is  no longer possible. Difficult to remember your own name. Control of movement and coordination are lost.   The worst pain imaginable 10 At this level, most patients pass out from pain. When this level is reached, collapse of the autonomic nervous system occurs, leading to a sudden drop in blood pressure and heart rate. This in turn results in a temporary and dramatic drop in blood flow to the brain, leading to a loss of consciousness. Fainting is one of the body's self defense mechanisms. Passing out puts the brain in a calmed state and causes it to shut down for a while, in  order to begin the healing process.    Summary: 1. Refer to this scale when providing [place] with your pain level. 2. Be accurate and careful when reporting your pain level. This will help with your care. 3. Over-reporting your pain level will lead to loss of credibility. 4. Even a level of 1/10 means that there is pain and will be treated at our facility. 5. High, inaccurate reporting will be documented as "Symptom Exaggeration", leading to loss of credibility and suspicions of possible secondary gains such as obtaining more narcotics, or wanting to appear disabled, for fraudulent reasons. 6. Only pain levels of 5 or below will be seen at our facility. 7. Pain levels of 6 and above will be sent to the Emergency Department and the appointment cancelled. ______________________________________________________________________________________________    BMI Assessment: Estimated body mass index is 31.32 kg/m as calculated from the following:   Height as of this encounter: _0  (1.702 m).   Weight as of this encounter: 200 lb (90.7 kg).  BMI interpretation table: BMI level Category Range association with higher incidence of chronic pain  <18 kg/m2 Underweight   18.5-24.9 kg/m2 Ideal body weight   25-29.9 kg/m2 Overweight Increased incidence by 20%  30-34.9 kg/m2 Obese (Class I) Increased incidence by 68%  35-39.9 kg/m2 Severe  obesity (Class II) Increased incidence by 136%  >40 kg/m2 Extreme obesity (Class III) Increased incidence by 254%   Patient's current BMI Ideal Body weight  Body mass index is 31.32 kg/m. Ideal body weight: 61.6 kg (135 lb 12.9 oz) Adjusted ideal body weight: 73.2 kg (161 lb 7.7 oz)   BMI Readings from Last 4 Encounters:  09/18/18 31.32 kg/m  07/31/18 31.95 kg/m  03/18/18 29.76 kg/m  11/27/17 31.20 kg/m   Wt Readings from Last 4 Encounters:  09/18/18 200 lb (90.7 kg)  07/31/18 204 lb (92.5 kg)  03/18/18 190 lb (86.2 kg)  11/27/17 199 lb 3.2 oz (90.4 kg)   Prescriptions for Tramadol and Cymbalta were sent to your pharmacy.  ____________________________________________________________________________________________  Preparing for Procedure with Sedation  Instructions: . Oral Intake: Do not eat or drink anything for at least 8 hours prior to your procedure. . Transportation: Public transportation is not allowed. Bring an adult driver. The driver must be physically present in our waiting room before any procedure can be started. [place] [place] Physical Assistance: Bring an adult physically capable of assisting you, in the event you need help. This adult should keep you company at home for at least 6 hours after the procedure. . Blood Pressure Medicine: Take your blood pressure medicine with a sip of water the morning of the procedure. . Blood thinners: Notify our staff if you are taking any blood thinners. Depending on which one you take, there will be specific instructions on how and when to stop it. . Diabetics on insulin: Notify the staff so that you can be scheduled 1st case in the morning. If your diabetes requires high dose insulin, take only  of your normal insulin dose the morning of the procedure and notify the staff that you have done so. . Preventing infections: Shower with an antibacterial soap the morning of your procedure. . Build-up your immune system: Take 1000 mg of Vitamin C  with every meal (3 times a day) the day prior to your procedure. [place] Kitchen Antibiotics: Inform the staff if you have a condition or reason that requires you to take antibiotics before dental procedures. . Pregnancy: If you are pregnant, call and cancel the procedure. [place] Kitchen  Sickness: If you have a cold, fever, or any active infections, call and cancel the procedure. . Arrival: You must be in the facility at least 30 minutes prior to your scheduled procedure. . Children: Do not bring children with you. . Dress appropriately: Bring dark clothing that you would not mind if they get stained. . Valuables: Do not bring any jewelry or valuables.  Procedure appointments are reserved for interventional treatments only. [place]land Kitchen No Prescription Refills. . No medication changes will be discussed during procedure appointments. . No disability issues will be discussed.  Reasons to call and reschedule or cancel your procedure: (Following these recommendations will minimize the risk of a serious complication.) . Surgeries: Avoid having procedures within 2 weeks of any surgery. (Avoid for 2 weeks before or after any surgery). . Flu Shots: Avoid having procedures within 2 weeks of a flu shots or . (Avoid for 2 weeks before or after immunizations). . Barium: Avoid having a procedure within 7-10 days after having had a radiological study involving the use of radiological contrast. (Myelograms, Barium swallow or enema study). . Heart attacks: Avoid any elective procedures or surgeries for the initial 6 months after a "Myocardial Infarction" (Heart Attack). . Blood thinners: It is imperative that you stop these medications before procedures. Let us know if you if you take any blood thinner.  . Infection: Avoid procedures during or within two weeks of an infection (including chest colds or gastrointestinal problems). Symptoms associated with infections include: Localized redness, fever, chills, night sweats or profuse sweating, burning  sensation when voiding, cough, congestion, stuffiness, runny nose, sore throat, diarrhea, nausea, vomiting, cold or Flu symptoms, recent or current infections. It is specially important if the infection is over the area that we intend to treat. [place] Kitchen Heart and lung problems: Symptoms that may suggest an active cardiopulmonary problem include: cough, chest pain, breathing difficulties or shortness of breath, dizziness, ankle swelling, uncontrolled high or unusually low blood pressure, and/or palpitations. If you are experiencing any of these symptoms, cancel your procedure and contact your primary care physician for an evaluation.  Remember:  Regular Business hours are:  Monday to Thursday 8:00 AM to 4:00 PM  Provider's Schedule: Milinda Pointer, MD:  Procedure days: Tuesday and Thursday 7:30 AM to 4:00 PM  Gillis Santa, MD:  Procedure days: Monday and Wednesday 7:30 AM to 4:00 PM ____________________________________________________________________________________________

## 2018-09-18 NOTE — Patient Instructions (Addendum)
____________________________________________________________________________________________  Medication Rules  Purpose: To inform patients, and their family members, of our rules and regulations.  Applies to: All patients receiving prescriptions (written or electronic).  Pharmacy of record: Pharmacy where electronic prescriptions will be sent. If written prescriptions are taken to a different pharmacy, please inform the nursing staff. The pharmacy listed in the electronic medical record should be the one where you would like electronic prescriptions to be sent.  Electronic prescriptions: In compliance with the [place] Strengthen Opioid Misuse Prevention (STOP) Act of 2017 (Session Law 2017-74/H243), effective September 11, 2018, all controlled substances must be electronically prescribed. Calling prescriptions to the pharmacy will cease to exist.  Prescription refills: Only during scheduled appointments. Applies to all prescriptions.  NOTE: The following applies primarily to controlled substances (Opioid* Pain Medications).   Patient's responsibilities: 1. Pain Pills: Bring all pain pills to every appointment (except for procedure appointments). 2. Pill Bottles: Bring pills in original pharmacy bottle. Always bring the newest bottle. Bring bottle, even if empty. 3. Medication refills: You are responsible for knowing and keeping track of what medications you take and those you need refilled. The day before your appointment: write a list of all prescriptions that need to be refilled. The day of the appointment: give the list to the admitting nurse. Prescriptions will be written only during appointments. If you forget a medication: it will not be "Called in", "Faxed", or "electronically sent". You will need to get another appointment to get these prescribed. No early refills. Do not call asking to have your prescription filled early. 4. Prescription Accuracy: You are responsible for  carefully inspecting your prescriptions before leaving our office. Have the discharge nurse carefully go over each prescription with you, before taking them home. Make sure that your name is accurately spelled, that your address is correct. Check the name and dose of your medication to make sure it is accurate. Check the number of pills, and the written instructions to make sure they are clear and accurate. Make sure that you are given enough medication to last until your next medication refill appointment. 5. Taking Medication: Take medication as prescribed. When it comes to controlled substances, taking less pills or less frequently than prescribed is permitted and encouraged. Never take more pills than instructed. Never take medication more frequently than prescribed.  6. Inform other Doctors: Always inform, all of your healthcare providers, of all the medications you take. 7. Pain Medication from other Providers: You are not allowed to accept any additional pain medication from any other Doctor or Healthcare provider. There are two exceptions to this rule. (see below) In the event that you require additional pain medication, you are responsible for notifying us, as stated below. 8. Medication Agreement: You are responsible for carefully reading and following our Medication Agreement. This must be signed before receiving any prescriptions from our practice. Safely store a copy of your signed Agreement. Violations to the Agreement will result in no further prescriptions. (Additional copies of our Medication Agreement are available upon request.) 9. Laws, Rules, & Regulations: All patients are expected to follow all Federal and State Laws, Statutes, Rules, & Regulations. Ignorance of the Laws does not constitute a valid excuse. The use of any illegal substances is prohibited. 10. Adopted CDC guidelines & recommendations: Target dosing levels will be at or below 60 MME/day. Use of benzodiazepines** is not  recommended.  Exceptions: There are only two exceptions to the rule of not receiving pain medications from other Healthcare Providers. 1.   Exception #1 (Emergencies): In the event of an emergency (i.e.: accident requiring emergency care), you are allowed to receive additional pain medication. However, you are responsible for: As soon as you are able, call our office [phone], at any time of the day or night, and leave a message stating your name, the date and nature of the emergency, and the name and dose of the medication prescribed. In the event that your call is answered by a member of our staff, make sure to document and save the date, time, and the name of the person that took your information.  2. Exception #2 (Planned Surgery): In the event that you are scheduled by another doctor or dentist to have any type of surgery or procedure, you are allowed (for a period no longer than 30 days), to receive additional pain medication, for the acute post-op pain. However, in this case, you are responsible for picking up a copy of our "Post-op Pain Management for Surgeons" handout, and giving it to your surgeon or dentist. This document is available at our office, and does not require an appointment to obtain it. Simply go to our office during business hours (Monday-Thursday from 8:00 AM to 4:00 PM) (Friday 8:00 AM to 12:00 Noon) or if you have a scheduled appointment with [place], prior to your surgery, and ask for it by name. In addition, you will need to provide [place] with your name, name of your surgeon, type of surgery, and date of procedure or surgery.  *Opioid medications include: morphine, codeine, oxycodone, oxymorphone, hydrocodone, hydromorphone, meperidine, tramadol, tapentadol, buprenorphine, fentanyl, methadone. **Benzodiazepine medications include: diazepam (Valium), alprazolam (Xanax), clonazepam (Klonopine), lorazepam (Ativan), clorazepate (Tranxene), chlordiazepoxide (Librium), estazolam (Prosom),  oxazepam (Serax), temazepam (Restoril), triazolam (Halcion) (Last updated: 11/08/2017) ____________________________________________________________________________________________    ______________________________________________________________________________________________  Specialty Pain Scale  Introduction:  There are significant differences in how pain is reported. The word pain usually refers to physical pain, but it is also a common synonym of suffering. The medical community uses a scale from 0 (zero) to 10 (ten) to report pain level. Zero (0) is described as "no pain", while ten (10) is described as "the worse pain you can imagine". The problem with this scale is that physical pain is reported along with suffering. Suffering refers to mental pain, or more often yet it refers to any unpleasant feeling, emotion or aversion associated with the perception of harm or threat of harm. It is the psychological component of pain.  Pain Specialists prefer to separate the two components. The pain scale used by this practice is the Verbal Numerical Rating Scale (VNRS-11). This scale is for the physical pain only. DO NOT INCLUDE how your pain psychologically affects you. This scale is for adults 75 years of age and older. It has 11 (eleven) levels. The 1st level is 0/10. This means: "right now, I have no pain". In the context of pain management, it also means: "right now, my physical pain is under control with the current therapy".  General Information:  The scale should reflect your current level of pain. Unless you are specifically asked for the level of your worst pain, or your average pain. If you are asked for one of these two, then it should be understood that it is over the past 24 hours.  Levels 1 (one) through 5 (five) are described below, and can be treated as an outpatient. Ambulatory pain management facilities such as ours are more than adequate to treat these levels. Levels 6 (six) through  10 (ten) are also described below, however, these must be treated as a hospitalized patient. While levels 6 (six) and 7 (seven) may be evaluated at an urgent care facility, levels 8 (eight) through 10 (ten) constitute medical emergencies and as such, they belong in a hospital's emergency department. When having these levels (as described below), do not come to our office. Our facility is not equipped to manage these levels. Go directly to an urgent care facility or an emergency department to be evaluated.  Definitions:  Activities of Daily Living (ADL): Activities of daily living (ADL or ADLs) is a term used in healthcare to refer to people's daily self-care activities. Health professionals often use a person's ability or inability to perform ADLs as a measurement of their functional status, particularly in regard to people post injury, with disabilities and the elderly. There are two ADL levels: Basic and Instrumental. Basic Activities of Daily Living (BADL  or BADLs) consist of self-care tasks that include: Bathing and showering; personal hygiene and grooming (including brushing/combing/styling hair); dressing; Toilet hygiene (getting to the toilet, cleaning oneself, and getting back up); eating and self-feeding (not including cooking or chewing and swallowing); functional mobility, often referred to as "transferring", as measured by the ability to walk, get in and out of bed, and get into and out of a chair; the broader definition (moving from one place to another while performing activities) is useful for people with different physical abilities who are still able to get around independently. Basic ADLs include the things many people do when they get up in the morning and get ready to go out of the house: get out of bed, go to the toilet, bathe, dress, groom, and eat. On the average, loss of function typically follows a particular order. Hygiene is the first to go, followed by loss of toilet use and  locomotion. The last to go is the ability to eat. When there is only one remaining area in which the person is independent, there is a 62.9% chance that it is eating and only a 3.5% chance that it is hygiene. Instrumental Activities of Daily Living (IADL or IADLs) are not necessary for fundamental functioning, but they let an individual live independently in a community. IADL consist of tasks that include: cleaning and maintaining the house; home establishment and maintenance; care of others (including selecting and supervising caregivers); care of pets; child rearing; managing money; managing financials (investments, etc.); meal preparation and cleanup; shopping for groceries and necessities; moving within the community; safety procedures and emergency responses; health management and maintenance (taking prescribed medications); and using the telephone or other form of communication.  Instructions:  Most patients tend to report their pain as a combination of two factors, their physical pain and their psychosocial pain. This last one is also known as "suffering" and it is reflection of how physical pain affects you socially and psychologically. From now on, report them separately.  From this point on, when asked to report your pain level, report only your physical pain. Use the following table for reference.  Pain Clinic Pain Levels (0-5/10)  Pain Level Score  Description  No Pain 0   Mild pain 1 Nagging, annoying, but does not interfere with basic activities of daily living (ADL). Patients are able to eat, bathe, get dressed, toileting (being able to get on and off the toilet and perform personal hygiene functions), transfer (move in and out of bed or a chair without assistance), and maintain continence (able to control bladder  and bowel functions). Blood pressure and heart rate are unaffected. A normal heart rate for a healthy adult ranges from 60 to 100 bpm (beats per minute).   Mild to moderate pain  2 Noticeable and distracting. Impossible to hide from other people. More frequent flare-ups. Still possible to adapt and function close to normal. It can be very annoying and may have occasional stronger flare-ups. With discipline, patients may get used to it and adapt.   Moderate pain 3 Interferes significantly with activities of daily living (ADL). It becomes difficult to feed, bathe, get dressed, get on and off the toilet or to perform personal hygiene functions. Difficult to get in and out of bed or a chair without assistance. Very distracting. With effort, it can be ignored when deeply involved in activities.   Moderately severe pain 4 Impossible to ignore for more than a few minutes. With effort, patients may still be able to manage work or participate in some social activities. Very difficult to concentrate. Signs of autonomic nervous system discharge are evident: dilated pupils (mydriasis); mild sweating (diaphoresis); sleep interference. Heart rate becomes elevated (>115 bpm). Diastolic blood pressure (lower number) rises above 100 mmHg. Patients find relief in laying down and not moving.   Severe pain 5 Intense and extremely unpleasant. Associated with frowning face and frequent crying. Pain overwhelms the senses.  Ability to do any activity or maintain social relationships becomes significantly limited. Conversation becomes difficult. Pacing back and forth is common, as getting into a comfortable position is nearly impossible. Pain wakes you up from deep sleep. Physical signs will be obvious: pupillary dilation; increased sweating; goosebumps; brisk reflexes; cold, clammy hands and feet; nausea, vomiting or dry heaves; loss of appetite; significant sleep disturbance with inability to fall asleep or to remain asleep. When persistent, significant weight loss is observed due to the complete loss of appetite and sleep deprivation.  Blood pressure and heart rate becomes significantly elevated. Caution:  If elevated blood pressure triggers a pounding headache associated with blurred vision, then the patient should immediately seek attention at an urgent or emergency care unit, as these may be signs of an impending stroke.    Emergency Department Pain Levels (6-10/10)  Emergency Room Pain 6 Severely limiting. Requires emergency care and should not be seen or managed at an outpatient pain management facility. Communication becomes difficult and requires great effort. Assistance to reach the emergency department may be required. Facial flushing and profuse sweating along with potentially dangerous increases in heart rate and blood pressure will be evident.   Distressing pain 7 Self-care is very difficult. Assistance is required to transport, or use restroom. Assistance to reach the emergency department will be required. Tasks requiring coordination, such as bathing and getting dressed become very difficult.   Disabling pain 8 Self-care is no longer possible. At this level, pain is disabling. The individual is unable to do even the most "basic" activities such as walking, eating, bathing, dressing, transferring to a bed, or toileting. Fine motor skills are lost. It is difficult to think clearly.   Incapacitating pain 9 Pain becomes incapacitating. Thought processing is no longer possible. Difficult to remember your own name. Control of movement and coordination are lost.   The worst pain imaginable 10 At this level, most patients pass out from pain. When this level is reached, collapse of the autonomic nervous system occurs, leading to a sudden drop in blood pressure and heart rate. This in turn results in a temporary and dramatic drop in  blood flow to the brain, leading to a loss of consciousness. Fainting is one of the body's self defense mechanisms. Passing out puts the brain in a calmed state and causes it to shut down for a while, in order to begin the healing process.    Summary: 1. Refer to this  scale when providing [place] with your pain level. 2. Be accurate and careful when reporting your pain level. This will help with your care. 3. Over-reporting your pain level will lead to loss of credibility. 4. Even a level of 1/10 means that there is pain and will be treated at our facility. 5. High, inaccurate reporting will be documented as "Symptom Exaggeration", leading to loss of credibility and suspicions of possible secondary gains such as obtaining more narcotics, or wanting to appear disabled, for fraudulent reasons. 6. Only pain levels of 5 or below will be seen at our facility. 7. Pain levels of 6 and above will be sent to the Emergency Department and the appointment cancelled. ______________________________________________________________________________________________    BMI Assessment: Estimated body mass index is 31.32 kg/m as calculated from the following:   Height as of this encounter: 5\' 7"  (1.702 m).   Weight as of this encounter: 200 lb (90.7 kg).  BMI interpretation table: BMI level Category Range association with higher incidence of chronic pain  <18 kg/m2 Underweight   18.5-24.9 kg/m2 Ideal body weight   25-29.9 kg/m2 Overweight Increased incidence by 20%  30-34.9 kg/m2 Obese (Class I) Increased incidence by 68%  35-39.9 kg/m2 Severe obesity (Class II) Increased incidence by 136%  >40 kg/m2 Extreme obesity (Class III) Increased incidence by 254%   Patient's current BMI Ideal Body weight  Body mass index is 31.32 kg/m. Ideal body weight: 61.6 kg (135 lb 12.9 oz) Adjusted ideal body weight: 73.2 kg (161 lb 7.7 oz)   BMI Readings from Last 4 Encounters:  09/18/18 31.32 kg/m  07/31/18 31.95 kg/m  03/18/18 29.76 kg/m  11/27/17 31.20 kg/m   Wt Readings from Last 4 Encounters:  09/18/18 200 lb (90.7 kg)  07/31/18 204 lb (92.5 kg)  03/18/18 190 lb (86.2 kg)  11/27/17 199 lb 3.2 oz (90.4 kg)   Prescriptions for Tramadol and Cymbalta were sent to your  pharmacy.  ____________________________________________________________________________________________  Preparing for Procedure with Sedation  Instructions: . Oral Intake: Do not eat or drink anything for at least 8 hours prior to your procedure. . Transportation: Public transportation is not allowed. Bring an adult driver. The driver must be physically present in our waiting room before any procedure can be started. [place] Kitchen Physical Assistance: Bring an adult physically capable of assisting you, in the event you need help. This adult should keep you company at home for at least 6 hours after the procedure. . Blood Pressure Medicine: Take your blood pressure medicine with a sip of water the morning of the procedure. . Blood thinners: Notify our staff if you are taking any blood thinners. Depending on which one you take, there will be specific instructions on how and when to stop it. . Diabetics on insulin: Notify the staff so that you can be scheduled 1st case in the morning. If your diabetes requires high dose insulin, take only  of your normal insulin dose the morning of the procedure and notify the staff that you have done so. . Preventing infections: Shower with an antibacterial soap the morning of your procedure. . Build-up your immune system: Take 1000 mg of Vitamin C with every meal (3 times a  day) the day prior to your procedure. Marland Kitchen Antibiotics: Inform the staff if you have a condition or reason that requires you to take antibiotics before dental procedures. . Pregnancy: If you are pregnant, call and cancel the procedure. . Sickness: If you have a cold, fever, or any active infections, call and cancel the procedure. . Arrival: You must be in the facility at least 30 minutes prior to your scheduled procedure. . Children: Do not bring children with you. . Dress appropriately: Bring dark clothing that you would not mind if they get stained. . Valuables: Do not bring any jewelry or  valuables.  Procedure appointments are reserved for interventional treatments only. Marland Kitchen No Prescription Refills. . No medication changes will be discussed during procedure appointments. . No disability issues will be discussed.  Reasons to call and reschedule or cancel your procedure: (Following these recommendations will minimize the risk of a serious complication.) . Surgeries: Avoid having procedures within 2 weeks of any surgery. (Avoid for 2 weeks before or after any surgery). . Flu Shots: Avoid having procedures within 2 weeks of a flu shots or . (Avoid for 2 weeks before or after immunizations). . Barium: Avoid having a procedure within 7-10 days after having had a radiological study involving the use of radiological contrast. (Myelograms, Barium swallow or enema study). . Heart attacks: Avoid any elective procedures or surgeries for the initial 6 months after a "Myocardial Infarction" (Heart Attack). . Blood thinners: It is imperative that you stop these medications before procedures. Let us know if you if you take any blood thinner.  . Infection: Avoid procedures during or within two weeks of an infection (including chest colds or gastrointestinal problems). Symptoms associated with infections include: Localized redness, fever, chills, night sweats or profuse sweating, burning sensation when voiding, cough, congestion, stuffiness, runny nose, sore throat, diarrhea, nausea, vomiting, cold or Flu symptoms, recent or current infections. It is specially important if the infection is over the area that we intend to treat. Marland Kitchen Heart and lung problems: Symptoms that may suggest an active cardiopulmonary problem include: cough, chest pain, breathing difficulties or shortness of breath, dizziness, ankle swelling, uncontrolled high or unusually low blood pressure, and/or palpitations. If you are experiencing any of these symptoms, cancel your procedure and contact your primary care physician for an  evaluation.  Remember:  Regular Business hours are:  Monday to Thursday 8:00 AM to 4:00 PM  Provider's Schedule: Milinda Pointer, MD:  Procedure days: Tuesday and Thursday 7:30 AM to 4:00 PM  Gillis Santa, MD:  Procedure days: Monday and Wednesday 7:30 AM to 4:00 PM ____________________________________________________________________________________________

## 2018-09-18 NOTE — Progress Notes (Signed)
Nursing Pain Medication Assessment:  Safety precautions to be maintained throughout the outpatient stay will include: orient to surroundings, keep bed in low position, maintain call bell within reach at all times, provide assistance with transfer out of bed and ambulation.  Medication Inspection Compliance: Pill count conducted under aseptic conditions, in front of the patient. Neither the pills nor the bottle was removed from the patient's sight at any time. Once count was completed pills were immediately returned to the patient in their original bottle.  Medication: Tramadol (Ultram) Pill/Patch Count: 4 of 240 pills remain Pill/Patch Appearance: Markings consistent with prescribed medication Bottle Appearance: Standard pharmacy container. Clearly labeled. Filled Date: 12/06/ 2019 Last Medication intake:  Today

## 2018-09-23 LAB — TOXASSURE SELECT 13 (MW), URINE

## 2018-12-02 ENCOUNTER — Other Ambulatory Visit: Payer: Self-pay | Admitting: Adult Health

## 2018-12-02 DIAGNOSIS — M62838 Other muscle spasm: Secondary | ICD-10-CM

## 2018-12-02 NOTE — Telephone Encounter (Signed)
Does he want to fill this? I keep getting this refill request but  have not seen the patient. Not sure if you want to fill or not.

## 2018-12-02 NOTE — Telephone Encounter (Signed)
Unsuer if you want to fill this or not.

## 2018-12-11 ENCOUNTER — Other Ambulatory Visit: Payer: Self-pay

## 2018-12-11 ENCOUNTER — Telehealth: Payer: Self-pay

## 2018-12-11 DIAGNOSIS — I10 Essential (primary) hypertension: Secondary | ICD-10-CM

## 2018-12-11 MED ORDER — AMLODIPINE BESYLATE 10 MG PO TABS: 10.0000 mg | ORAL_TABLET | Freq: Every day | ORAL | 1 refills | Status: DC

## 2018-12-11 NOTE — Telephone Encounter (Signed)
Spoke with need follow up appt in 2 months for bp check

## 2019-01-01 ENCOUNTER — Other Ambulatory Visit: Payer: Self-pay

## 2019-01-01 DIAGNOSIS — I10 Essential (primary) hypertension: Secondary | ICD-10-CM

## 2019-01-01 MED ORDER — AMLODIPINE BESYLATE 10 MG PO TABS: 10.0000 mg | ORAL_TABLET | Freq: Every day | ORAL | 0 refills | Status: DC

## 2019-02-10 ENCOUNTER — Other Ambulatory Visit: Payer: Self-pay

## 2019-02-10 ENCOUNTER — Ambulatory Visit: Payer: Self-pay | Admitting: Nurse Practitioner

## 2019-02-10 VITALS — BP 141/93 | HR 96 | Ht 67.0 in | Wt 190.0 lb

## 2019-02-10 DIAGNOSIS — I10 Essential (primary) hypertension: Secondary | ICD-10-CM

## 2019-02-10 DIAGNOSIS — R002 Palpitations: Secondary | ICD-10-CM

## 2019-02-10 MED ORDER — ATENOLOL 25 MG PO TABS: 25.0000 mg | ORAL_TABLET | Freq: Every day | ORAL | 1 refills | Status: DC

## 2019-02-10 MED ORDER — ATENOLOL 25 MG PO TABS: 25.0000 mg | ORAL_TABLET | Freq: Every day | ORAL | 3 refills | Status: DC

## 2019-02-10 NOTE — Progress Notes (Signed)
[facility] [place], [place] [address]  Internal MEDICINE  Telephone Visit  Patient Name: Shawna Carrillo  [id number]  [id number]  Date of Service: 02/18/2019  I connected with the patient at 4:44pm by telephone and verified the patients identity using two identifiers.   I discussed the limitations, risks, security and privacy concerns of performing an evaluation and management service by telephone and the availability of in person appointments. I also discussed with the patient that there may be a patient responsible charge related to the service.  The patient expressed understanding and agrees to proceed.    Chief Complaint  Patient presents with  . Telephone Assessment  . Telephone Screen  . Medical Management of Chronic Issues    follow up for Bp check  . Hypertension    The patient has been contacted via webcam for follow up visit due to concerns for spread of novel coronavirus. Patient has noted elevated blood pressure and heart rate. Experiencing palpitations. States that sometimes palpitations will wake her up from sleep. Denies chest pain, pressure, or shortness of breath. Currently take amlodipine 10mg  daily. She has not had lab work checked in some time.       Current Medication: Outpatient Encounter Medications as of 02/10/2019  Medication Sig  . amLODipine (NORVASC) 10 MG tablet Take 1 tablet (10 mg total) by mouth daily.  . diazepam (VALIUM) 5 MG tablet TAKE 1 TABLET BY MOUTH AT BEDTIME AS NEEDED FOR ANXIETY OR MUSCLE SPASMS  . DULoxetine (CYMBALTA) 60 MG capsule Take 1 capsule (60 mg total) by mouth daily.  Marland Kitchen ibuprofen (ADVIL,MOTRIN) 200 MG tablet Take 800 mg by mouth as needed for mild pain.  . traMADol (ULTRAM) 50 MG tablet Take 2 tablets (100 mg total) by mouth every 6 (six) hours as needed.  Marland Kitchen atenolol (TENORMIN) 25 MG tablet Take 1 tablet (25 mg total) by mouth daily.  . [DISCONTINUED] atenolol (TENORMIN) 25 MG tablet Take 1 tablet (25 mg  total) by mouth daily.   No facility-administered encounter medications on file as of 02/10/2019.     Surgical History: Past Surgical History:  Procedure Laterality Date  . hilar myectomy    . LUMBAR FUSION  2012   FAILED    Medical History: Past Medical History:  Diagnosis Date  . B12 deficiency   . History of anemia 07/12/2015  . History of migraine 07/12/2015  . History of peptic ulcer disease 07/12/2015  . Insomnia     Family History: Family History  Problem Relation Age of Onset  . Diabetes Mother   . Heart disease Father     Social History   Socioeconomic History  . Marital status: Married    Spouse name: Not on file  . Number of children: Not on file  . Years of education: Not on file  . Highest education level: Not on file  Occupational History  . Not on file  Social Needs  . Financial resource strain: Not on file  . Food insecurity:    Worry: Not on file    Inability: Not on file  . Transportation needs:    Medical: Not on file    Non-medical: Not on file  Tobacco Use  . Smoking status: Current Every Day Smoker    Packs/day: 1.00    Types: Cigarettes  . Smokeless tobacco: Never Used  Substance and Sexual Activity  . Alcohol use: No    Alcohol/week: 0.0 standard drinks  . Drug use: No  .  Sexual activity: Not on file  Lifestyle  . Physical activity:    Days per week: Not on file    Minutes per session: Not on file  . Stress: Not on file  Relationships  . Social connections:    Talks on phone: Not on file    Gets together: Not on file    Attends religious service: Not on file    Active member of club or organization: Not on file    Attends meetings of clubs or organizations: Not on file    Relationship status: Not on file  . Intimate partner violence:    Fear of current or ex partner: Not on file    Emotionally abused: Not on file    Physically abused: Not on file    Forced sexual activity: Not on file  Other Topics Concern  . Not on  file  Social History Narrative  . Not on file      Review of Systems  Constitutional: Negative for activity change, chills, fatigue and unexpected weight change.  HENT: Negative for congestion, postnasal drip, rhinorrhea, sneezing and sore throat.   Eyes: Negative.   Respiratory: Negative for cough, chest tightness, shortness of breath and wheezing.   Cardiovascular: Negative for chest pain and palpitations.       Blood pressure elevated  Gastrointestinal: Negative for abdominal pain, constipation, diarrhea, nausea and vomiting.  Endocrine: Negative for cold intolerance, heat intolerance, polydipsia and polyuria.  Musculoskeletal: Negative for arthralgias, back pain, joint swelling, myalgias and neck pain.  Skin: Negative for rash.  Allergic/Immunologic: Negative for environmental allergies.  Neurological: Negative for tremors, numbness and headaches.  Hematological: Does not bruise/bleed easily.  Psychiatric/Behavioral: Negative for behavioral problems (Depression), dysphoric mood, sleep disturbance and suicidal ideas. The patient is not nervous/anxious.     Today's Vitals   02/10/19 1609  BP: (!) 141/93  Pulse: 96  Weight: 190 lb (86.2 kg)  Height: 5\' 7"  (1.702 m)   Body mass index is 29.76 kg/m.  Observation/Objective:   The patient is alert and oriented. She is pleasant and answers all questions appropriately. Breathing is non-labored. She is in no acute distress at this time.    Assessment/Plan:  1. Essential hypertension Add atenolol 25mg  QPM. Check labs for further evaluation. Lab slip to be sent.  - atenolol (TENORMIN) 25 MG tablet; Take 1 tablet (25 mg total) by mouth daily.  Dispense: 90 tablet; Refill: 3  2. Palpitations Add atenolol 25mg  QPM. Check labs for further evaluation. Lab slip to be sent.    General Counseling: Inez verbalizes understanding of the findings of today's phone visit and agrees with plan of treatment. I have discussed any further  diagnostic evaluation that may be needed or ordered today. We also reviewed her medications today. she has been encouraged to call the office with any questions or concerns that should arise related to todays visit.  Hypertension Counseling:   The following hypertensive lifestyle modification were recommended and discussed:  1. Limiting alcohol intake to less than 1 oz/day of ethanol:(24 oz of beer or 8 oz of wine or 2 oz of 100-proof whiskey). 2. Take baby ASA 81 mg daily. 3. Importance of regular aerobic exercise and losing weight. 4. Reduce dietary saturated fat and cholesterol intake for overall cardiovascular health. 5. Maintaining adequate dietary potassium, calcium, and magnesium intake. 6. Regular monitoring of the blood pressure. 7. Reduce sodium intake to less than 100 mmol/day (less than 2.3 gm of sodium or less than 6 gm  of sodium choride)   This patient was seen by [place] with Dr Lavera Guise as a part of collaborative care agreement  Meds ordered this encounter  Medications  . DISCONTD: atenolol (TENORMIN) 25 MG tablet    Sig: Take 1 tablet (25 mg total) by mouth daily.    Dispense:  30 tablet    Refill:  1    Order Specific Question:   Supervising Provider    Answer:   Lavera Guise [[id number]]  . atenolol (TENORMIN) 25 MG tablet    Sig: Take 1 tablet (25 mg total) by mouth daily.    Dispense:  90 tablet    Refill:  3    Order Specific Question:   Supervising Provider    Answer:   Lavera Guise [[id number]]    Time spent: 30 Minutes    Dr Lavera Guise Internal medicine

## 2019-02-18 ENCOUNTER — Encounter: Payer: Self-pay | Admitting: Nurse Practitioner

## 2019-02-18 DIAGNOSIS — R002 Palpitations: Secondary | ICD-10-CM | POA: Insufficient documentation

## 2019-02-18 DIAGNOSIS — I10 Essential (primary) hypertension: Secondary | ICD-10-CM | POA: Insufficient documentation

## 2019-03-10 ENCOUNTER — Ambulatory Visit: Payer: Self-pay | Admitting: Nurse Practitioner

## 2019-03-13 NOTE — Progress Notes (Signed)
Attempted to call patient for Mondays visit. No answer, Left message on answering machine.

## 2019-03-16 NOTE — Progress Notes (Signed)
Unable to contact patient.

## 2019-03-17 ENCOUNTER — Other Ambulatory Visit: Payer: Self-pay

## 2019-03-17 ENCOUNTER — Ambulatory Visit: Payer: Self-pay | Admitting: Pain Medicine

## 2019-03-17 DIAGNOSIS — Z789 Other specified health status: Secondary | ICD-10-CM | POA: Insufficient documentation

## 2019-03-17 DIAGNOSIS — G8929 Other chronic pain: Secondary | ICD-10-CM | POA: Insufficient documentation

## 2019-03-17 DIAGNOSIS — M5416 Radiculopathy, lumbar region: Secondary | ICD-10-CM | POA: Insufficient documentation

## 2019-03-17 DIAGNOSIS — M899 Disorder of bone, unspecified: Secondary | ICD-10-CM | POA: Insufficient documentation

## 2019-03-17 DIAGNOSIS — Z79899 Other long term (current) drug therapy: Secondary | ICD-10-CM | POA: Insufficient documentation

## 2019-03-17 NOTE — Patient Instructions (Signed)
____________________________________________________________________________________________  Medication Rules  Purpose: To inform patients, and their family members, of our rules and regulations.  Applies to: All patients receiving prescriptions (written or electronic).  Pharmacy of record: Pharmacy where electronic prescriptions will be sent. If written prescriptions are taken to a different pharmacy, please inform the nursing staff. The pharmacy listed in the electronic medical record should be the one where you would like electronic prescriptions to be sent.  Electronic prescriptions: In compliance with the [place] Strengthen Opioid Misuse Prevention (STOP) Act of 2017 (Session Law 2017-74/H243), effective September 11, 2018, all controlled substances must be electronically prescribed. Calling prescriptions to the pharmacy will cease to exist.  Prescription refills: Only during scheduled appointments. Applies to all prescriptions.  NOTE: The following applies primarily to controlled substances (Opioid* Pain Medications).   Patient's responsibilities: 1. Pain Pills: Bring all pain pills to every appointment (except for procedure appointments). 2. Pill Bottles: Bring pills in original pharmacy bottle. Always bring the newest bottle. Bring bottle, even if empty. 3. Medication refills: You are responsible for knowing and keeping track of what medications you take and those you need refilled. The day before your appointment: write a list of all prescriptions that need to be refilled. The day of the appointment: give the list to the admitting nurse. Prescriptions will be written only during appointments. No prescriptions will be written on procedure days. If you forget a medication: it will not be "Called in", "Faxed", or "electronically sent". You will need to get another appointment to get these prescribed. No early refills. Do not call asking to have your prescription filled  early. 4. Prescription Accuracy: You are responsible for carefully inspecting your prescriptions before leaving our office. Have the discharge nurse carefully go over each prescription with you, before taking them home. Make sure that your name is accurately spelled, that your address is correct. Check the name and dose of your medication to make sure it is accurate. Check the number of pills, and the written instructions to make sure they are clear and accurate. Make sure that you are given enough medication to last until your next medication refill appointment. 5. Taking Medication: Take medication as prescribed. When it comes to controlled substances, taking less pills or less frequently than prescribed is permitted and encouraged. Never take more pills than instructed. Never take medication more frequently than prescribed.  6. Inform other Doctors: Always inform, all of your healthcare providers, of all the medications you take. 7. Pain Medication from other Providers: You are not allowed to accept any additional pain medication from any other Doctor or Healthcare provider. There are two exceptions to this rule. (see below) In the event that you require additional pain medication, you are responsible for notifying us, as stated below. 8. Medication Agreement: You are responsible for carefully reading and following our Medication Agreement. This must be signed before receiving any prescriptions from our practice. Safely store a copy of your signed Agreement. Violations to the Agreement will result in no further prescriptions. (Additional copies of our Medication Agreement are available upon request.) 9. Laws, Rules, & Regulations: All patients are expected to follow all Federal and State Laws, Statutes, Rules, & Regulations. Ignorance of the Laws does not constitute a valid excuse. The use of any illegal substances is prohibited. 10. Adopted CDC guidelines & recommendations: Target dosing levels will be  at or below 60 MME/day. Use of benzodiazepines** is not recommended.  Exceptions: There are only two exceptions to the rule of not   receiving pain medications from other Healthcare Providers. 1. Exception #1 (Emergencies): In the event of an emergency (i.e.: accident requiring emergency care), you are allowed to receive additional pain medication. However, you are responsible for: As soon as you are able, call our office [phone], at any time of the day or night, and leave a message stating your name, the date and nature of the emergency, and the name and dose of the medication prescribed. In the event that your call is answered by a member of our staff, make sure to document and save the date, time, and the name of the person that took your information.  2. Exception #2 (Planned Surgery): In the event that you are scheduled by another doctor or dentist to have any type of surgery or procedure, you are allowed (for a period no longer than 30 days), to receive additional pain medication, for the acute post-op pain. However, in this case, you are responsible for picking up a copy of our "Post-op Pain Management for Surgeons" handout, and giving it to your surgeon or dentist. This document is available at our office, and does not require an appointment to obtain it. Simply go to our office during business hours (Monday-Thursday from 8:00 AM to 4:00 PM) (Friday 8:00 AM to 12:00 Noon) or if you have a scheduled appointment with us, prior to your surgery, and ask for it by name. In addition, you will need to provide us with your name, name of your surgeon, type of surgery, and date of procedure or surgery.  *Opioid medications include: morphine, codeine, oxycodone, oxymorphone, hydrocodone, hydromorphone, meperidine, tramadol, tapentadol, buprenorphine, fentanyl, methadone. **Benzodiazepine medications include: diazepam (Valium), alprazolam (Xanax), clonazepam (Klonopine), lorazepam (Ativan), clorazepate  (Tranxene), chlordiazepoxide (Librium), estazolam (Prosom), oxazepam (Serax), temazepam (Restoril), triazolam (Halcion) (Last updated: 11/08/2017) ____________________________________________________________________________________________   ____________________________________________________________________________________________  Medication Recommendations and Reminders  Applies to: All patients receiving prescriptions (written and/or electronic).  Medication Rules & Regulations: These rules and regulations exist for your safety and that of others. They are not flexible and neither are we. Dismissing or ignoring them will be considered "non-compliance" with medication therapy, resulting in complete and irreversible termination of such therapy. (See document titled "Medication Rules" for more details.) In all conscience, because of safety reasons, we cannot continue providing a therapy where the patient does not follow instructions.  Pharmacy of record:   Definition: This is the pharmacy where your electronic prescriptions will be sent.   We do not endorse any particular pharmacy.  You are not restricted in your choice of pharmacy.  The pharmacy listed in the electronic medical record should be the one where you want electronic prescriptions to be sent.  If you choose to change pharmacy, simply notify our nursing staff of your choice of new pharmacy.  Recommendations:  Keep all of your pain medications in a safe place, under lock and key, even if you live alone.   After you fill your prescription, take 1 week's worth of pills and put them away in a safe place. You should keep a separate, properly labeled bottle for this purpose. The remainder should be kept in the original bottle. Use this as your primary supply, until it runs out. Once it's gone, then you know that you have 1 week's worth of medicine, and it is time to come in for a prescription refill. If you do this correctly, it  is unlikely that you will ever run out of medicine.  To make sure that the above recommendation works,   it is very important that you make sure your medication refill appointments are scheduled at least 1 week before you run out of medicine. To do this in an effective manner, make sure that you do not leave the office without scheduling your next medication management appointment. Always ask the nursing staff to show you in your prescription , when your medication will be running out. Then arrange for the receptionist to get you a return appointment, at least 7 days before you run out of medicine. Do not wait until you have 1 or 2 pills left, to come in. This is very poor planning and does not take into consideration that we may need to cancel appointments due to bad weather, sickness, or emergencies affecting our staff.  "Partial Fill": If for any reason your pharmacy does not have enough pills/tablets to completely fill or refill your prescription, do not allow for a "partial fill". You will need a separate prescription to fill the remaining amount, which we will not provide. If the reason for the partial fill is your insurance, you will need to talk to the pharmacist about payment alternatives for the remaining tablets, but again, do not accept a partial fill.  Prescription refills and/or changes in medication(s):   Prescription refills, and/or changes in dose or medication, will be conducted only during scheduled medication management appointments. (Applies to both, written and electronic prescriptions.)  No refills on procedure days. No medication will be changed or started on procedure days. No changes, adjustments, and/or refills will be conducted on a procedure day. Doing so will interfere with the diagnostic portion of the procedure.  No phone refills. No medications will be "called into the pharmacy".  No Fax refills.  No weekend refills.  No Holliday refills.  No after hours  refills.  Remember:  Business hours are:  Monday to Thursday 8:00 AM to 4:00 PM Provider's Schedule: Crystal King, NP - Appointments are:  Medication management: Monday to Thursday 8:00 AM to 4:00 PM Briona Korpela, MD - Appointments are:  Medication management: Monday and Wednesday 8:00 AM to 4:00 PM Procedure day: Tuesday and Thursday 7:30 AM to 4:00 PM Bilal Lateef, MD - Appointments are:  Medication management: Tuesday and Thursday 8:00 AM to 4:00 PM Procedure day: Monday and Wednesday 7:30 AM to 4:00 PM (Last update: 11/08/2017) ____________________________________________________________________________________________   ____________________________________________________________________________________________  CANNABIDIOL (AKA: CBD Oil or Pills)  Applies to: All patients receiving prescriptions of controlled substances (written and/or electronic).  General Information: Cannabidiol (CBD) was discovered in 1940. It is one of some 113 identified cannabinoids in cannabis (Marijuana) plants, accounting for up to 40% of the plant's extract. As of 2018, preliminary clinical research on cannabidiol included studies of anxiety, cognition, movement disorders, and pain.  Cannabidiol is consummed in multiple ways, including inhalation of cannabis smoke or vapor, as an aerosol spray into the cheek, and by mouth. It may be supplied as CBD oil containing CBD as the active ingredient (no added tetrahydrocannabinol (THC) or terpenes), a full-plant CBD-dominant hemp extract oil, capsules, dried cannabis, or as a liquid solution. CBD is thought not have the same psychoactivity as THC, and may affect the actions of THC. Studies suggest that CBD may interact with different biological targets, including cannabinoid receptors and other neurotransmitter receptors. As of 2018 the mechanism of action for its biological effects has not been determined.  In the [place], cannabidiol has a limited  approval by the Food and Drug Administration (FDA) for treatment of only two types   of epilepsy disorders. The side effects of long-term use of the drug include somnolence, decreased appetite, diarrhea, fatigue, malaise, weakness, sleeping problems, and others.  CBD remains a Schedule I drug prohibited for any use.  Legality: Some manufacturers ship CBD products nationally, an illegal action which the FDA has not enforced in 2018, with CBD remaining the subject of an FDA investigational new drug evaluation, and is not considered legal as a dietary supplement or food ingredient as of December 2018. Federal illegality has made it difficult historically to conduct research on CBD. CBD is openly sold in head shops and health food stores in some states where such sales have not been explicitly legalized.  Warning: Because it is not FDA approved for general use or treatment of pain, it is not required to undergo the same manufacturing controls as prescription drugs.  This means that the available cannabidiol (CBD) may be contaminated with THC.  If this is the case, it will trigger a positive urine drug screen (UDS) test for cannabinoids (Marijuana).  Because a positive UDS for illicit substances is a violation of our medication agreement, your opioid analgesics (pain medicine) may be permanently discontinued. (Last update: 11/29/2017) ____________________________________________________________________________________________    

## 2019-03-20 ENCOUNTER — Encounter: Payer: Self-pay | Admitting: Pain Medicine

## 2019-03-23 NOTE — Progress Notes (Signed)
Pain Management Virtual Encounter Note - Virtual Visit via Telephone Telehealth (real-time audio visits between healthcare provider and patient).   Patient's Phone No. & Preferred Pharmacy:  [phone] (home); [phone] (mobile); (Preferred) [phone] [email or web address]  [place], [place] - [place] [address] Phone: [phone] Fax: [phone]    Pre-screening note:  Our staff contacted Shawna Carrillo and offered her an "in person", "face-to-face" appointment versus a telephone encounter. She indicated preferring the telephone encounter, at this time.   Reason for Virtual Visit: COVID-19*  Social distancing based on CDC and AMA recommendations.   I contacted Shawna Carrillo on 03/24/2019 via telephone.      I clearly identified myself as Gaspar Cola, MD. I verified that I was speaking with the correct person using two identifiers (Name: Shawna Carrillo, and date of birth: Oct 14, 1960).  Advanced Informed Consent I sought verbal advanced consent from Shawna Carrillo for virtual visit interactions. I informed Shawna Carrillo of possible security and privacy concerns, risks, and limitations associated with providing "not-in-person" medical evaluation and management services. I also informed Shawna Carrillo of the availability of "in-person" appointments. Finally, I informed her that there would be a charge for the virtual visit and that she could be  personally, fully or partially, financially responsible for it. Shawna Carrillo expressed understanding and agreed to proceed.   Historic Elements   Shawna Carrillo is a 58 y.o. year old, female patient evaluated today after her last encounter by our practice on Visit date not found. Shawna Carrillo  has a past medical history of B12 deficiency, History of anemia (07/12/2015), History of migraine (07/12/2015), History of peptic ulcer disease (07/12/2015), and Insomnia. She  also  has a past surgical history that includes Lumbar fusion (2012) and hilar myectomy. Shawna Carrillo has a current medication list which includes the following prescription(s): amlodipine, atenolol, diazepam, duloxetine, ibuprofen, and tramadol. She  reports that she has been smoking cigarettes. She has been smoking about 1.00 pack per day. She has never used smokeless tobacco. She reports that she does not drink alcohol or use drugs. Shawna Carrillo is allergic to latex and codeine.   HPI  Today, she is being contacted for medication management.  Pharmacotherapy Assessment  Analgesic: Tramadol 100 mg every 6 hours (400 mg/day of tramadol) MME/day:40 mg/day.   Monitoring: Pharmacotherapy: No side-effects or adverse reactions reported. [place] PMP: PDMP reviewed during this encounter.       Compliance: No problems identified. Effectiveness: Clinically acceptable. Plan: Refer to "POC".  Pertinent Labs   SAFETY SCREENING Profile No results found for: SARSCOV2NAA, COVIDSOURCE, STAPHAUREUS, MRSAPCR, HCVAB, HIV, PREGTESTUR Renal Function Lab Results  Component Value Date   BUN 8 02/17/2015   CREATININE 0.71 02/17/2015   BCR 11 02/17/2015   GFRAA 111 02/17/2015   GFRNONAA 96 02/17/2015   Hepatic Function Lab Results  Component Value Date   AST 24 02/17/2015   ALT 23 02/17/2015   ALBUMIN 4.6 02/17/2015   UDS Summary  Date Value Ref Range Status  09/18/2018 FINAL  Final    Comment:    ==================================================================== TOXASSURE SELECT 13 (MW) ==================================================================== Test                             Result       Flag       Units Drug Present and Declared for Prescription Verification   Tramadol                       >  5952        EXPECTED   ng/mg creat   O-Desmethyltramadol            >5952        EXPECTED   ng/mg creat   N-Desmethyltramadol            4495         EXPECTED   ng/mg creat    Source of  tramadol is a prescription medication.    O-desmethyltramadol and N-desmethyltramadol are expected    metabolites of tramadol. Drug Absent but Declared for Prescription Verification   Diazepam                       Not Detected UNEXPECTED ng/mg creat ==================================================================== Test                      Result    Flag   Units      Ref Range   Creatinine              84               mg/dL      >=20 ==================================================================== Declared Medications:  The flagging and interpretation on this report are based on the  following declared medications.  Unexpected results may arise from  inaccuracies in the declared medications.  **Note: The testing scope of this panel includes these medications:  Diazepam  Tramadol  **Note: The testing scope of this panel does not include following  reported medications:  Amlodipine Besylate  Duloxetine  Ibuprofen ==================================================================== For clinical consultation, please call [phone]. ====================================================================    Note: Above Lab results reviewed.  Recent imaging  DG Shoulder Left Portable CLINICAL DATA:  Shoulder dislocation  EXAM: LEFT SHOULDER - 1 VIEW  COMPARISON:  Left shoulder radiograph earlier the same day.  FINDINGS: Interval reduction of left glenohumeral dislocation. No fracture visualized.  IMPRESSION: Reduction of left glenohumeral dislocation, now in anatomic alignment.  Electronically Signed   By: Ulyses Jarred M.D.   On: 10/31/2016 06:26 DG Shoulder Left CLINICAL DATA:  Shoulder dislocation  EXAM: LEFT SHOULDER - 2+ VIEW  COMPARISON:  None.  FINDINGS: There is anterior dislocation of the left glenohumeral joint. No fracture identified.  IMPRESSION: Anterior left glenohumeral dislocation.  Electronically Signed   By: Ulyses Jarred M.D.    On: 10/31/2016 06:00  Assessment  The primary encounter diagnosis was Chronic pain syndrome. Diagnoses of Chronic low back pain (Primary Area of Pain) (Bilateral) w/ sciatica (Bilateral) (R>L), Chronic lower extremity pain (Secondary area of Pain) (Left) (L 2-3, L3-4) , Chronic musculoskeletal pain, Pharmacologic therapy, Disorder of skeletal system, and Problems influencing health status were also pertinent to this visit.  Plan of Care  I am having Shawna Carrillo maintain her ibuprofen, diazepam, amLODipine, atenolol, DULoxetine, and traMADol.  Pharmacotherapy (Medications Ordered): Meds ordered this encounter  Medications  . DULoxetine (CYMBALTA) 60 MG capsule    Sig: Take 1 capsule (60 mg total) by mouth daily.    Dispense:  90 capsule    Refill:  1    Fill one day early if pharmacy is closed on scheduled refill date. May substitute for generic if available.  . traMADol (ULTRAM) 50 MG tablet    Sig: Take 2 tablets (100 mg total) by mouth every 6 (six) hours as needed.    Dispense:  240 tablet    Refill:  5    Chronic Pain: STOP Act (  Not applicable) Fill 1 day early if closed on refill date. 1st fill on: 03/29/2019. To last until: 09/25/2019. Avoid benzodiazepines within 8 hours of opioids   Orders:  Orders Placed This Encounter  Procedures  . ToxASSURE Select 13 (MW), Urine    Volume: 30 ml(s). Minimum 3 ml of urine is needed. Document temperature of fresh sample. Indications: Long term (current) use of opiate analgesic (Z79.891)  . Comp. Metabolic Panel (12)    With GFR. Indications: Chronic Pain Syndrome (G89.4) & Pharmacotherapy (D42.876)    Order Specific Question:   Has the patient fasted?    Answer:   No    Order Specific Question:   CC Results    Answer:   PCP-NURSE [[id number]]  . Magnesium    Indication: Pharmacologic therapy (I20.355)    Order Specific Question:   CC Results    Answer:   PCP-NURSE [[id number]]  . Vitamin B12    Indication: Pharmacologic therapy (A45.364).     Order Specific Question:   CC Results    Answer:   PCP-NURSE [[id number]]  . Sedimentation rate    Indication: Disorder of skeletal system (M89.9)    Order Specific Question:   CC Results    Answer:   PCP-NURSE [[id number]]  . 25-Hydroxyvitamin D Lcms D2+D3    Indication: Disorder of skeletal system (M89.9).    Order Specific Question:   CC Results    Answer:   PCP-NURSE [[id number]]  . C-reactive protein    Indication: Problems influencing health status (Z78.9)    Order Specific Question:   CC Results    Answer:   PCP-NURSE [[id number]]   Follow-up plan:   Return in about 6 months (around 09/24/2019) for (VV), E/M (MM).     Considering: Diagnostic bilateral lumbar facet block  Diagnostic caudal epidural steroid injection + epidurogram for the lower extremity pain   Palliative PRN treatment(s): Palliativebilateral lumbar facet block  Palliativecaudal epidural steroid injection + epidurogram for the lower extremity pain.    Recent Visits No visits were found meeting these conditions.  Showing recent visits within past 90 days and meeting all other requirements   Today's Visits Date Type Provider Dept  03/24/19 Office Visit Milinda Pointer, MD [facility]  Showing today's visits and meeting all other requirements   Future Appointments No visits were found meeting these conditions.  Showing future appointments within next 90 days and meeting all other requirements   I discussed the assessment and treatment plan with the patient. The patient was provided an opportunity to ask questions and all were answered. The patient agreed with the plan and demonstrated an understanding of the instructions.  Patient advised to call back or seek an in-person evaluation if the symptoms or condition worsens.  Total duration of non-face-to-face encounter: 12 minutes.  Note by: Gaspar Cola, MD Date: 03/24/2019; Time: 2:12 PM  Note: This dictation was prepared with Dragon  dictation. Any transcriptional errors that may result from this process are unintentional.  Disclaimer:  * Given the special circumstances of the COVID-19 pandemic, the federal government has announced that the Office for Civil Rights (OCR) will exercise its enforcement discretion and will not impose penalties on physicians using telehealth in the event of noncompliance with regulatory requirements under the [place] (HIPAA) in connection with the good faith provision of telehealth during the XIHWT-88 national public health emergency. ([place])

## 2019-03-23 NOTE — Patient Instructions (Signed)
____________________________________________________________________________________________  Medication Rules  Purpose: To inform patients, and their family members, of our rules and regulations.  Applies to: All patients receiving prescriptions (written or electronic).  Pharmacy of record: Pharmacy where electronic prescriptions will be sent. If written prescriptions are taken to a different pharmacy, please inform the nursing staff. The pharmacy listed in the electronic medical record should be the one where you would like electronic prescriptions to be sent.  Electronic prescriptions: In compliance with the [place] Strengthen Opioid Misuse Prevention (STOP) Act of 2017 (Session Law 2017-74/H243), effective September 11, 2018, all controlled substances must be electronically prescribed. Calling prescriptions to the pharmacy will cease to exist.  Prescription refills: Only during scheduled appointments. Applies to all prescriptions.  NOTE: The following applies primarily to controlled substances (Opioid* Pain Medications).   Patient's responsibilities: 1. Pain Pills: Bring all pain pills to every appointment (except for procedure appointments). 2. Pill Bottles: Bring pills in original pharmacy bottle. Always bring the newest bottle. Bring bottle, even if empty. 3. Medication refills: You are responsible for knowing and keeping track of what medications you take and those you need refilled. The day before your appointment: write a list of all prescriptions that need to be refilled. The day of the appointment: give the list to the admitting nurse. Prescriptions will be written only during appointments. No prescriptions will be written on procedure days. If you forget a medication: it will not be "Called in", "Faxed", or "electronically sent". You will need to get another appointment to get these prescribed. No early refills. Do not call asking to have your prescription filled  early. 4. Prescription Accuracy: You are responsible for carefully inspecting your prescriptions before leaving our office. Have the discharge nurse carefully go over each prescription with you, before taking them home. Make sure that your name is accurately spelled, that your address is correct. Check the name and dose of your medication to make sure it is accurate. Check the number of pills, and the written instructions to make sure they are clear and accurate. Make sure that you are given enough medication to last until your next medication refill appointment. 5. Taking Medication: Take medication as prescribed. When it comes to controlled substances, taking less pills or less frequently than prescribed is permitted and encouraged. Never take more pills than instructed. Never take medication more frequently than prescribed.  6. Inform other Doctors: Always inform, all of your healthcare providers, of all the medications you take. 7. Pain Medication from other Providers: You are not allowed to accept any additional pain medication from any other Doctor or Healthcare provider. There are two exceptions to this rule. (see below) In the event that you require additional pain medication, you are responsible for notifying us, as stated below. 8. Medication Agreement: You are responsible for carefully reading and following our Medication Agreement. This must be signed before receiving any prescriptions from our practice. Safely store a copy of your signed Agreement. Violations to the Agreement will result in no further prescriptions. (Additional copies of our Medication Agreement are available upon request.) 9. Laws, Rules, & Regulations: All patients are expected to follow all Federal and State Laws, Statutes, Rules, & Regulations. Ignorance of the Laws does not constitute a valid excuse. The use of any illegal substances is prohibited. 10. Adopted CDC guidelines & recommendations: Target dosing levels will be  at or below 60 MME/day. Use of benzodiazepines** is not recommended.  Exceptions: There are only two exceptions to the rule of not   receiving pain medications from other Healthcare Providers. 1. Exception #1 (Emergencies): In the event of an emergency (i.e.: accident requiring emergency care), you are allowed to receive additional pain medication. However, you are responsible for: As soon as you are able, call our office [phone], at any time of the day or night, and leave a message stating your name, the date and nature of the emergency, and the name and dose of the medication prescribed. In the event that your call is answered by a member of our staff, make sure to document and save the date, time, and the name of the person that took your information.  2. Exception #2 (Planned Surgery): In the event that you are scheduled by another doctor or dentist to have any type of surgery or procedure, you are allowed (for a period no longer than 30 days), to receive additional pain medication, for the acute post-op pain. However, in this case, you are responsible for picking up a copy of our "Post-op Pain Management for Surgeons" handout, and giving it to your surgeon or dentist. This document is available at our office, and does not require an appointment to obtain it. Simply go to our office during business hours (Monday-Thursday from 8:00 AM to 4:00 PM) (Friday 8:00 AM to 12:00 Noon) or if you have a scheduled appointment with us, prior to your surgery, and ask for it by name. In addition, you will need to provide us with your name, name of your surgeon, type of surgery, and date of procedure or surgery.  *Opioid medications include: morphine, codeine, oxycodone, oxymorphone, hydrocodone, hydromorphone, meperidine, tramadol, tapentadol, buprenorphine, fentanyl, methadone. **Benzodiazepine medications include: diazepam (Valium), alprazolam (Xanax), clonazepam (Klonopine), lorazepam (Ativan), clorazepate  (Tranxene), chlordiazepoxide (Librium), estazolam (Prosom), oxazepam (Serax), temazepam (Restoril), triazolam (Halcion) (Last updated: 11/08/2017) ____________________________________________________________________________________________   ____________________________________________________________________________________________  Medication Recommendations and Reminders  Applies to: All patients receiving prescriptions (written and/or electronic).  Medication Rules & Regulations: These rules and regulations exist for your safety and that of others. They are not flexible and neither are we. Dismissing or ignoring them will be considered "non-compliance" with medication therapy, resulting in complete and irreversible termination of such therapy. (See document titled "Medication Rules" for more details.) In all conscience, because of safety reasons, we cannot continue providing a therapy where the patient does not follow instructions.  Pharmacy of record:   Definition: This is the pharmacy where your electronic prescriptions will be sent.   We do not endorse any particular pharmacy.  You are not restricted in your choice of pharmacy.  The pharmacy listed in the electronic medical record should be the one where you want electronic prescriptions to be sent.  If you choose to change pharmacy, simply notify our nursing staff of your choice of new pharmacy.  Recommendations:  Keep all of your pain medications in a safe place, under lock and key, even if you live alone.   After you fill your prescription, take 1 week's worth of pills and put them away in a safe place. You should keep a separate, properly labeled bottle for this purpose. The remainder should be kept in the original bottle. Use this as your primary supply, until it runs out. Once it's gone, then you know that you have 1 week's worth of medicine, and it is time to come in for a prescription refill. If you do this correctly, it  is unlikely that you will ever run out of medicine.  To make sure that the above recommendation works,   it is very important that you make sure your medication refill appointments are scheduled at least 1 week before you run out of medicine. To do this in an effective manner, make sure that you do not leave the office without scheduling your next medication management appointment. Always ask the nursing staff to show you in your prescription , when your medication will be running out. Then arrange for the receptionist to get you a return appointment, at least 7 days before you run out of medicine. Do not wait until you have 1 or 2 pills left, to come in. This is very poor planning and does not take into consideration that we may need to cancel appointments due to bad weather, sickness, or emergencies affecting our staff.  "Partial Fill": If for any reason your pharmacy does not have enough pills/tablets to completely fill or refill your prescription, do not allow for a "partial fill". You will need a separate prescription to fill the remaining amount, which we will not provide. If the reason for the partial fill is your insurance, you will need to talk to the pharmacist about payment alternatives for the remaining tablets, but again, do not accept a partial fill.  Prescription refills and/or changes in medication(s):   Prescription refills, and/or changes in dose or medication, will be conducted only during scheduled medication management appointments. (Applies to both, written and electronic prescriptions.)  No refills on procedure days. No medication will be changed or started on procedure days. No changes, adjustments, and/or refills will be conducted on a procedure day. Doing so will interfere with the diagnostic portion of the procedure.  No phone refills. No medications will be "called into the pharmacy".  No Fax refills.  No weekend refills.  No Holliday refills.  No after hours  refills.  Remember:  Business hours are:  Monday to Thursday 8:00 AM to 4:00 PM Provider's Schedule: Crystal King, NP - Appointments are:  Medication management: Monday to Thursday 8:00 AM to 4:00 PM Avyn Aden, MD - Appointments are:  Medication management: Monday and Wednesday 8:00 AM to 4:00 PM Procedure day: Tuesday and Thursday 7:30 AM to 4:00 PM Bilal Lateef, MD - Appointments are:  Medication management: Tuesday and Thursday 8:00 AM to 4:00 PM Procedure day: Monday and Wednesday 7:30 AM to 4:00 PM (Last update: 11/08/2017) ____________________________________________________________________________________________   ____________________________________________________________________________________________  CANNABIDIOL (AKA: CBD Oil or Pills)  Applies to: All patients receiving prescriptions of controlled substances (written and/or electronic).  General Information: Cannabidiol (CBD) was discovered in 1940. It is one of some 113 identified cannabinoids in cannabis (Marijuana) plants, accounting for up to 40% of the plant's extract. As of 2018, preliminary clinical research on cannabidiol included studies of anxiety, cognition, movement disorders, and pain.  Cannabidiol is consummed in multiple ways, including inhalation of cannabis smoke or vapor, as an aerosol spray into the cheek, and by mouth. It may be supplied as CBD oil containing CBD as the active ingredient (no added tetrahydrocannabinol (THC) or terpenes), a full-plant CBD-dominant hemp extract oil, capsules, dried cannabis, or as a liquid solution. CBD is thought not have the same psychoactivity as THC, and may affect the actions of THC. Studies suggest that CBD may interact with different biological targets, including cannabinoid receptors and other neurotransmitter receptors. As of 2018 the mechanism of action for its biological effects has not been determined.  In the [place], cannabidiol has a limited  approval by the Food and Drug Administration (FDA) for treatment of only two types   of epilepsy disorders. The side effects of long-term use of the drug include somnolence, decreased appetite, diarrhea, fatigue, malaise, weakness, sleeping problems, and others.  CBD remains a Schedule I drug prohibited for any use.  Legality: Some manufacturers ship CBD products nationally, an illegal action which the FDA has not enforced in 2018, with CBD remaining the subject of an FDA investigational new drug evaluation, and is not considered legal as a dietary supplement or food ingredient as of December 2018. Federal illegality has made it difficult historically to conduct research on CBD. CBD is openly sold in head shops and health food stores in some states where such sales have not been explicitly legalized.  Warning: Because it is not FDA approved for general use or treatment of pain, it is not required to undergo the same manufacturing controls as prescription drugs.  This means that the available cannabidiol (CBD) may be contaminated with THC.  If this is the case, it will trigger a positive urine drug screen (UDS) test for cannabinoids (Marijuana).  Because a positive UDS for illicit substances is a violation of our medication agreement, your opioid analgesics (pain medicine) may be permanently discontinued. (Last update: 11/29/2017) ____________________________________________________________________________________________    

## 2019-03-24 ENCOUNTER — Ambulatory Visit: Payer: Worker's Compensation | Attending: Nurse Practitioner | Admitting: Pain Medicine

## 2019-03-24 ENCOUNTER — Other Ambulatory Visit: Payer: Self-pay

## 2019-03-24 DIAGNOSIS — M79605 Pain in left leg: Secondary | ICD-10-CM | POA: Diagnosis not present

## 2019-03-24 DIAGNOSIS — G894 Chronic pain syndrome: Secondary | ICD-10-CM | POA: Diagnosis not present

## 2019-03-24 DIAGNOSIS — Z789 Other specified health status: Secondary | ICD-10-CM

## 2019-03-24 DIAGNOSIS — M7918 Myalgia, other site: Secondary | ICD-10-CM

## 2019-03-24 DIAGNOSIS — M5441 Lumbago with sciatica, right side: Secondary | ICD-10-CM

## 2019-03-24 DIAGNOSIS — M5442 Lumbago with sciatica, left side: Secondary | ICD-10-CM

## 2019-03-24 DIAGNOSIS — Z79899 Other long term (current) drug therapy: Secondary | ICD-10-CM

## 2019-03-24 DIAGNOSIS — M899 Disorder of bone, unspecified: Secondary | ICD-10-CM

## 2019-03-24 DIAGNOSIS — G8929 Other chronic pain: Secondary | ICD-10-CM

## 2019-03-24 MED ORDER — TRAMADOL HCL 50 MG PO TABS: 100.0000 mg | ORAL_TABLET | Freq: Four times a day (QID) | ORAL | 5 refills | Status: DC | PRN

## 2019-03-24 MED ORDER — DULOXETINE HCL 60 MG PO CPEP: 60.0000 mg | ORAL_CAPSULE | Freq: Every day | ORAL | 1 refills | Status: DC

## 2019-03-31 ENCOUNTER — Other Ambulatory Visit
Admission: RE | Admit: 2019-03-31 | Discharge: 2019-03-31 | Disposition: A | Discharge status: Home / Self Care | Payer: Self-pay | Source: Ambulatory Visit | Attending: Nurse Practitioner | Admitting: Nurse Practitioner

## 2019-03-31 ENCOUNTER — Other Ambulatory Visit
Admission: RE | Admit: 2019-03-31 | Discharge: 2019-03-31 | Disposition: A | Discharge status: Home / Self Care | Payer: Self-pay | Source: Ambulatory Visit | Attending: Pain Medicine | Admitting: Pain Medicine

## 2019-03-31 DIAGNOSIS — Z789 Other specified health status: Secondary | ICD-10-CM | POA: Insufficient documentation

## 2019-03-31 DIAGNOSIS — D691 Qualitative platelet defects: Secondary | ICD-10-CM | POA: Insufficient documentation

## 2019-03-31 DIAGNOSIS — Z79899 Other long term (current) drug therapy: Secondary | ICD-10-CM | POA: Insufficient documentation

## 2019-03-31 DIAGNOSIS — G894 Chronic pain syndrome: Secondary | ICD-10-CM | POA: Insufficient documentation

## 2019-03-31 DIAGNOSIS — M899 Disorder of bone, unspecified: Secondary | ICD-10-CM | POA: Insufficient documentation

## 2019-03-31 DIAGNOSIS — E756 Lipid storage disorder, unspecified: Secondary | ICD-10-CM | POA: Insufficient documentation

## 2019-03-31 DIAGNOSIS — E0781 Sick-euthyroid syndrome: Secondary | ICD-10-CM | POA: Insufficient documentation

## 2019-03-31 DIAGNOSIS — E559 Vitamin D deficiency, unspecified: Secondary | ICD-10-CM | POA: Insufficient documentation

## 2019-03-31 LAB — CBC WITH DIFFERENTIAL/PLATELET
Abs Immature Granulocytes: 0.03 10*3/uL (ref 0.00–0.07)
Basophils Absolute: 0.1 10*3/uL (ref 0.0–0.1)
Basophils Relative: 1 %
Eosinophils Absolute: 0.2 10*3/uL (ref 0.0–0.5)
Eosinophils Relative: 2 %
HCT: 42.8 % (ref 36.0–46.0)
Hemoglobin: 14.5 g/dL (ref 12.0–15.0)
Immature Granulocytes: 0 %
Lymphocytes Relative: 31 %
Lymphs Abs: 2.7 10*3/uL (ref 0.7–4.0)
MCH: 30.3 pg (ref 26.0–34.0)
MCHC: 33.9 g/dL (ref 30.0–36.0)
MCV: 89.4 fL (ref 80.0–100.0)
Monocytes Absolute: 0.5 10*3/uL (ref 0.1–1.0)
Monocytes Relative: 6 %
Neutro Abs: 5.2 10*3/uL (ref 1.7–7.7)
Neutrophils Relative %: 60 %
Platelets: 490 10*3/uL — ABNORMAL HIGH (ref 150–400)
RBC: 4.79 MIL/uL (ref 3.87–5.11)
RDW: 14.1 % (ref 11.5–15.5)
WBC: 8.7 10*3/uL (ref 4.0–10.5)
nRBC: 0 % (ref 0.0–0.2)

## 2019-03-31 LAB — COMPREHENSIVE METABOLIC PANEL
ALT: 37 U/L (ref 0–44)
AST: 28 U/L (ref 15–41)
Albumin: 4.8 g/dL (ref 3.5–5.0)
Alkaline Phosphatase: 84 U/L (ref 38–126)
Anion gap: 10 (ref 5–15)
BUN: 9 mg/dL (ref 6–20)
CO2: 24 mmol/L (ref 22–32)
Calcium: 9.8 mg/dL (ref 8.9–10.3)
Chloride: 106 mmol/L (ref 98–111)
Creatinine, Ser: 0.57 mg/dL (ref 0.44–1.00)
GFR calc Af Amer: >60 mL/min (ref >60)
GFR calc non Af Amer: >60 mL/min (ref >60)
Glucose, Bld: 101 mg/dL — ABNORMAL HIGH (ref 70–99)
Potassium: 3.6 mmol/L (ref 3.5–5.1)
Sodium: 140 mmol/L (ref 135–145)
Total Bilirubin: 0.4 mg/dL (ref 0.3–1.2)
Total Protein: 8.6 g/dL — ABNORMAL HIGH (ref 6.5–8.1)

## 2019-03-31 LAB — T4, FREE: Free T4: 0.74 ng/dL (ref 0.61–1.12)

## 2019-03-31 LAB — URINE DRUG SCREEN, QUALITATIVE (ARMC ONLY)
Amphetamines, Ur Screen: NOT DETECTED
Barbiturates, Ur Screen: NOT DETECTED
Benzodiazepine, Ur Scrn: NOT DETECTED
Cannabinoid 50 Ng, Ur ~~LOC~~: NOT DETECTED
Cocaine Metabolite,Ur ~~LOC~~: NOT DETECTED
MDMA (Ecstasy)Ur Screen: NOT DETECTED
Methadone Scn, Ur: NOT DETECTED
Opiate, Ur Screen: NOT DETECTED
Phencyclidine (PCP) Ur S: NOT DETECTED
Tricyclic, Ur Screen: NOT DETECTED

## 2019-03-31 LAB — LIPID PANEL
Cholesterol: 246 mg/dL — ABNORMAL HIGH (ref 0–200)
HDL: 58 mg/dL (ref >40)
LDL Cholesterol: 165 mg/dL — ABNORMAL HIGH (ref 0–99)
Total CHOL/HDL Ratio: 4.2 RATIO
Triglycerides: 113 mg/dL (ref <150)
VLDL: 23 mg/dL (ref 0–40)

## 2019-03-31 LAB — TSH: TSH: 1.787 u[IU]/mL (ref 0.350–4.500)

## 2019-03-31 LAB — C-REACTIVE PROTEIN: CRP: 1.4 mg/dL — ABNORMAL HIGH (ref <1.0)

## 2019-03-31 LAB — VITAMIN B12: Vitamin B-12: 213 pg/mL (ref 180–914)

## 2019-03-31 LAB — SEDIMENTATION RATE: Sed Rate: 20 mm/hr (ref 0–30)

## 2019-03-31 LAB — MAGNESIUM: Magnesium: 2.3 mg/dL (ref 1.7–2.4)

## 2019-04-01 LAB — T3: T3, Total: 163 ng/dL (ref 71–180)

## 2019-04-01 LAB — VITAMIN D 25 HYDROXY (VIT D DEFICIENCY, FRACTURES): Vit D, 25-Hydroxy: 22.1 ng/mL — ABNORMAL LOW (ref 30.0–100.0)

## 2019-04-28 ENCOUNTER — Other Ambulatory Visit: Payer: Self-pay | Admitting: Internal Medicine

## 2019-04-28 DIAGNOSIS — I10 Essential (primary) hypertension: Secondary | ICD-10-CM

## 2019-04-28 MED ORDER — AMLODIPINE BESYLATE 10 MG PO TABS: 10.0000 mg | ORAL_TABLET | Freq: Every day | ORAL | 2 refills | Status: DC

## 2019-05-09 ENCOUNTER — Other Ambulatory Visit: Payer: Self-pay | Admitting: Pain Medicine

## 2019-05-09 DIAGNOSIS — R7982 Elevated C-reactive protein (CRP): Secondary | ICD-10-CM

## 2019-05-09 DIAGNOSIS — E559 Vitamin D deficiency, unspecified: Secondary | ICD-10-CM

## 2019-05-09 MED ORDER — ERGOCALCIFEROL 1.25 MG (50000 UT) PO CAPS: 50000.0000 [IU] | ORAL_CAPSULE | ORAL | 0 refills | Status: AC

## 2019-05-09 MED ORDER — CALCIUM CARBONATE 600 MG PO TABS: 600.0000 mg | ORAL_TABLET | Freq: Two times a day (BID) | ORAL | 5 refills | Status: DC

## 2019-05-09 MED ORDER — VITAMIN D3 125 MCG (5000 UT) PO CAPS: 1.0000 | ORAL_CAPSULE | Freq: Every day | ORAL | 5 refills | Status: DC

## 2019-05-22 ENCOUNTER — Ambulatory Visit (INDEPENDENT_AMBULATORY_CARE_PROVIDER_SITE_OTHER): Payer: Self-pay | Admitting: Nurse Practitioner

## 2019-05-22 ENCOUNTER — Other Ambulatory Visit: Payer: Self-pay

## 2019-05-22 ENCOUNTER — Encounter: Payer: Self-pay | Admitting: Nurse Practitioner

## 2019-05-22 VITALS — BP 136/83 | HR 90 | Resp 16 | Ht 67.0 in | Wt 202.0 lb

## 2019-05-22 DIAGNOSIS — I10 Essential (primary) hypertension: Secondary | ICD-10-CM

## 2019-05-22 DIAGNOSIS — M62838 Other muscle spasm: Secondary | ICD-10-CM

## 2019-05-22 DIAGNOSIS — E782 Mixed hyperlipidemia: Secondary | ICD-10-CM

## 2019-05-22 MED ORDER — ATENOLOL 25 MG PO TABS: 25.0000 mg | ORAL_TABLET | Freq: Every day | ORAL | 3 refills | Status: DC

## 2019-05-22 MED ORDER — ERYTHROMYCIN 5 MG/GM OP OINT: 1.0000 "application " | TOPICAL_OINTMENT | Freq: Four times a day (QID) | OPHTHALMIC | 1 refills | Status: DC

## 2019-05-22 MED ORDER — DIAZEPAM 5 MG PO TABS: ORAL_TABLET | ORAL | 3 refills | Status: DC

## 2019-05-22 MED ORDER — AMLODIPINE BESYLATE 10 MG PO TABS: 10.0000 mg | ORAL_TABLET | Freq: Every day | ORAL | 2 refills | Status: DC

## 2019-05-22 NOTE — Progress Notes (Signed)
[facility] [place], [place] [address]  Internal MEDICINE  Office Visit Note  Patient Name: Shawna Carrillo  [id number]  [id number]  Date of Service: 06/01/2019  Chief Complaint  Patient presents with  . Follow-up    started atenolol, lab results  . Eye Problem    sores around eyes and ocassional itching    The patient is here for routine follow up. She has lesions preset around upper and lower eyelid of the left eye. Started as small, pale lesion which could be wiped away. As time went on, entire eye became very swollen. She is waking up every morning with crusted drainage over the left eye. She states that sometimes the eye itches, but not all the time. Symptoms have been going on for several weeks. Gradually worsening.  Started atenolol 25mg  daily at her last visit when she was having palpitations and mild elevation of blood pressure. Doing well with addition of this medication. Heart rate and blood pressure both doing well. She reports no negative side effects.  Finally she had labs done prior to this visit. Her LDL and total cholesterol were mildly elevated. Discussed heart healthy diet and increased exercise as well as adding fish oil to daily regimen to help lower cholesterol.       Current Medication: Outpatient Encounter Medications as of 05/22/2019  Medication Sig  . amLODipine (NORVASC) 10 MG tablet Take 1 tablet (10 mg total) by mouth daily.  Marland Kitchen atenolol (TENORMIN) 25 MG tablet Take 1 tablet (25 mg total) by mouth daily.  . calcium carbonate (CALCIUM 600) 600 MG TABS tablet Take 1 tablet (600 mg total) by mouth 2 (two) times daily with a meal.  . Cholecalciferol (VITAMIN D3) 125 MCG (5000 UT) CAPS Take 1 capsule (5,000 Units total) by mouth daily with breakfast. Take along with calcium and magnesium.  . diazepam (VALIUM) 5 MG tablet TAKE 1 TABLET BY MOUTH AT BEDTIME AS NEEDED FOR ANXIETY OR MUSCLE SPASMS  . DULoxetine (CYMBALTA) 60 MG capsule Take  1 capsule (60 mg total) by mouth daily.  . ergocalciferol (VITAMIN D2) 1.25 MG (50000 UT) capsule Take 1 capsule (50,000 Units total) by mouth 2 (two) times a week. X 6 weeks.  Marland Kitchen erythromycin ophthalmic ointment Place 1 application into the left eye 4 (four) times daily.  Marland Kitchen ibuprofen (ADVIL,MOTRIN) 200 MG tablet Take 800 mg by mouth as needed for mild pain.  . traMADol (ULTRAM) 50 MG tablet Take 2 tablets (100 mg total) by mouth every 6 (six) hours as needed.  . [DISCONTINUED] amLODipine (NORVASC) 10 MG tablet Take 1 tablet (10 mg total) by mouth daily.  . [DISCONTINUED] atenolol (TENORMIN) 25 MG tablet Take 1 tablet (25 mg total) by mouth daily.  . [DISCONTINUED] diazepam (VALIUM) 5 MG tablet TAKE 1 TABLET BY MOUTH AT BEDTIME AS NEEDED FOR ANXIETY OR MUSCLE SPASMS   No facility-administered encounter medications on file as of 05/22/2019.     Surgical History: Past Surgical History:  Procedure Laterality Date  . hilar myectomy    . LUMBAR FUSION  2012   FAILED    Medical History: Past Medical History:  Diagnosis Date  . B12 deficiency   . History of anemia 07/12/2015  . History of migraine 07/12/2015  . History of peptic ulcer disease 07/12/2015  . Insomnia     Family History: Family History  Problem Relation Age of Onset  . Diabetes Mother   . Heart disease Father     Social  History   Socioeconomic History  . Marital status: Married    Spouse name: Not on file  . Number of children: Not on file  . Years of education: Not on file  . Highest education level: Not on file  Occupational History  . Not on file  Social Needs  . Financial resource strain: Not on file  . Food insecurity    Worry: Not on file    Inability: Not on file  . Transportation needs    Medical: Not on file    Non-medical: Not on file  Tobacco Use  . Smoking status: Current Every Day Smoker    Packs/day: 1.00    Types: Cigarettes  . Smokeless tobacco: Never Used  Substance and Sexual Activity   . Alcohol use: No    Alcohol/week: 0.0 standard drinks  . Drug use: No  . Sexual activity: Not on file  Lifestyle  . Physical activity    Days per week: Not on file    Minutes per session: Not on file  . Stress: Not on file  Relationships  . Social Herbalist on phone: Not on file    Gets together: Not on file    Attends religious service: Not on file    Active member of club or organization: Not on file    Attends meetings of clubs or organizations: Not on file    Relationship status: Not on file  . Intimate partner violence    Fear of current or ex partner: Not on file    Emotionally abused: Not on file    Physically abused: Not on file    Forced sexual activity: Not on file  Other Topics Concern  . Not on file  Social History Narrative  . Not on file      Review of Systems  Constitutional: Negative for activity change, chills, fatigue and unexpected weight change.  HENT: Negative for congestion, postnasal drip, rhinorrhea, sneezing and sore throat.   Respiratory: Negative for cough, chest tightness and shortness of breath.   Cardiovascular: Negative for chest pain and palpitations.       Blood pressure elevated  Gastrointestinal: Negative for abdominal pain, constipation, diarrhea, nausea and vomiting.  Endocrine: Negative for cold intolerance, heat intolerance, polydipsia and polyuria.  Musculoskeletal: Negative for arthralgias, back pain, joint swelling, myalgias and neck pain.  Skin: Negative for rash.  Allergic/Immunologic: Negative for environmental allergies.  Neurological: Negative for tremors, numbness and headaches.  Hematological: Does not bruise/bleed easily.  Psychiatric/Behavioral: Negative for behavioral problems (Depression), dysphoric mood, sleep disturbance and suicidal ideas. The patient is not nervous/anxious.     Today's Vitals   05/22/19 1544  BP: 136/83  Pulse: 90  Resp: 16  SpO2: 97%  Weight: 202 lb (91.6 kg)  Height: 5\' 7"   (1.702 m)   Body mass index is 31.64 kg/m.  Physical Exam Vitals signs and nursing note reviewed.  Constitutional:      General: She is not in acute distress.    Appearance: Normal appearance. She is well-developed. She is not diaphoretic.  HENT:     Head: Normocephalic and atraumatic.     Mouth/Throat:     Pharynx: No oropharyngeal exudate.  Eyes:     Pupils: Pupils are equal, round, and reactive to light.  Neck:     Musculoskeletal: Normal range of motion and neck supple.     Thyroid: No thyromegaly.     Vascular: No JVD.     Trachea: No  tracheal deviation.  Cardiovascular:     Rate and Rhythm: Normal rate and regular rhythm.     Heart sounds: Normal heart sounds. No murmur. No friction rub. No gallop.   Pulmonary:     Effort: Pulmonary effort is normal. No respiratory distress.     Breath sounds: Normal breath sounds. No wheezing or rales.  Chest:     Chest wall: No tenderness.  Abdominal:     General: Bowel sounds are normal.     Palpations: Abdomen is soft.  Musculoskeletal: Normal range of motion.  Lymphadenopathy:     Cervical: No cervical adenopathy.  Skin:    General: Skin is warm and dry.  Neurological:     Mental Status: She is alert and oriented to person, place, and time.     Cranial Nerves: No cranial nerve deficit.  Psychiatric:        Behavior: Behavior normal.        Thought Content: Thought content normal.        Judgment: Judgment normal.    Assessment/Plan: 1. Essential hypertension Blood pressure stable. Continue amlodipine and atenolol as prescribed. Refills provided today.  - amLODipine (NORVASC) 10 MG tablet; Take 1 tablet (10 mg total) by mouth daily.  Dispense: 90 tablet; Refill: 2 - atenolol (TENORMIN) 25 MG tablet; Take 1 tablet (25 mg total) by mouth daily.  Dispense: 90 tablet; Refill: 3  2. Mixed hyperlipidemia Reviewed labs showing elevated LDL and total cholesterol. Heart healthy diet and exercise discussed with patient. Recheck in  one year.   3. Muscle spasm May take diazepam 5mg  at bedtime as needed for muscle pain and spasms. New prescription sent to the pharmacy today.  - diazepam (VALIUM) 5 MG tablet; TAKE 1 TABLET BY MOUTH AT BEDTIME AS NEEDED FOR ANXIETY OR MUSCLE SPASMS  Dispense: 30 tablet; Refill: 3  General Counseling: Shawna Carrillo verbalizes understanding of the findings of todays visit and agrees with plan of treatment. I have discussed any further diagnostic evaluation that may be needed or ordered today. We also reviewed her medications today. she has been encouraged to call the office with any questions or concerns that should arise related to todays visit.  Hypertension Counseling:   The following hypertensive lifestyle modification were recommended and discussed:  1. Limiting alcohol intake to less than 1 oz/day of ethanol:(24 oz of beer or 8 oz of wine or 2 oz of 100-proof whiskey). 2. Take baby ASA 81 mg daily. 3. Importance of regular aerobic exercise and losing weight. 4. Reduce dietary saturated fat and cholesterol intake for overall cardiovascular health. 5. Maintaining adequate dietary potassium, calcium, and magnesium intake. 6. Regular monitoring of the blood pressure. 7. Reduce sodium intake to less than 100 mmol/day (less than 2.3 gm of sodium or less than 6 gm of sodium choride)   This patient was seen by [place] with Dr Lavera Guise as a part of collaborative care agreement  Meds ordered this encounter  Medications  . amLODipine (NORVASC) 10 MG tablet    Sig: Take 1 tablet (10 mg total) by mouth daily.    Dispense:  90 tablet    Refill:  2    Order Specific Question:   Supervising Provider    Answer:   Lavera Guise [[id number]]  . atenolol (TENORMIN) 25 MG tablet    Sig: Take 1 tablet (25 mg total) by mouth daily.    Dispense:  90 tablet    Refill:  3  Order Specific Question:   Supervising Provider    Answer:   Lavera Guise [[id number]]  . erythromycin ophthalmic  ointment    Sig: Place 1 application into the left eye 4 (four) times daily.    Dispense:  3.5 g    Refill:  1    Order Specific Question:   Supervising Provider    Answer:   Lavera Guise [[id number]]  . diazepam (VALIUM) 5 MG tablet    Sig: TAKE 1 TABLET BY MOUTH AT BEDTIME AS NEEDED FOR ANXIETY OR MUSCLE SPASMS    Dispense:  30 tablet    Refill:  3    Order Specific Question:   Supervising Provider    Answer:   Lavera Guise [[id number]]    Time spent: 76 Minutes      Dr Lavera Guise Internal medicine

## 2019-07-03 ENCOUNTER — Other Ambulatory Visit: Payer: Self-pay | Admitting: Pain Medicine

## 2019-07-03 DIAGNOSIS — G894 Chronic pain syndrome: Secondary | ICD-10-CM

## 2019-09-03 ENCOUNTER — Encounter: Payer: Self-pay | Admitting: Pain Medicine

## 2019-09-03 ENCOUNTER — Telehealth: Payer: Self-pay | Admitting: *Deleted

## 2019-09-07 NOTE — Progress Notes (Signed)
Virtual Encounter - Pain Management PROVIDER NOTE: Information contained herein reflects review and annotations entered in association with encounter. Interpretation of such information and data should be left to medically-trained personnel. Information provided to patient can be located elsewhere in the medical record under "Patient Instructions". Document created using STT-dictation technology, any transcriptional errors that may result from process are unintentional.    Contact & Pharmacy Preferred: [phone] Home: [phone] (home) Mobile: [phone] (mobile) E-mail: [email or web address]  [place], [place] - [address] [place] [address] Phone: [phone] Fax: [phone]   Pre-screening  Shawna Carrillo offered "in-person" vs "virtual" encounter. She indicated preferring virtual for this encounter.   Reason COVID-19*  Social distancing based on CDC and AMA recommendations.   I contacted Eusebio Me on 09/08/2019 via telephone.      I clearly identified myself as Gaspar Cola, MD. I verified that I was speaking with the correct person using two identifiers (Name: Shawna Carrillo, and date of birth: October 31, 1960).  Consent I sought verbal advanced consent from Eusebio Me for virtual visit interactions. I informed Shawna Carrillo of possible security and privacy concerns, risks, and limitations associated with providing "not-in-person" medical evaluation and management services. I also informed Shawna Carrillo of the availability of "in-person" appointments. Finally, I informed her that there would be a charge for the virtual visit and that she could be  personally, fully or partially, financially responsible for it. Shawna Carrillo expressed understanding and agreed to proceed.   Historic Elements   [name] is a 58 y.o. year old, female patient evaluated today after her last encounter by our practice on  09/03/2019. Shawna Carrillo  has a past medical history of B12 deficiency, History of anemia (07/12/2015), History of migraine (07/12/2015), History of peptic ulcer disease (07/12/2015), and Insomnia. She also  has a past surgical history that includes Lumbar fusion (2012) and hilar myectomy. Shawna Carrillo has a current medication list which includes the following prescription(s): amlodipine, atenolol, [START ON 11/05/2019] calcium carbonate, [START ON 11/05/2019] vitamin d3, diazepam, [START ON 09/25/2019] duloxetine, erythromycin, ibuprofen, and [START ON 09/25/2019] tramadol. She  reports that she has been smoking cigarettes. She has been smoking about 1.00 pack per day. She has never used smokeless tobacco. She reports that she does not drink alcohol or use drugs. [name] is allergic to latex and codeine.   HPI  Today, she is being contacted for medication management.  Pharmacotherapy Assessment  Analgesic: Tramadol 100 mg every 6 hours (400 mg/day of tramadol) MME/day:40 mg/day.   Monitoring: Pharmacotherapy: No side-effects or adverse reactions reported. [place] PMP: PDMP reviewed during this encounter.       Compliance: No problems identified. Effectiveness: Clinically acceptable. Plan: Refer to "POC".  UDS:  Summary  Date Value Ref Range Status  09/18/2018 FINAL  Final    Comment:    ==================================================================== TOXASSURE SELECT 13 (MW) ==================================================================== Test                             Result       Flag       Units Drug Present and Declared for Prescription Verification   Tramadol                       >5952        EXPECTED   ng/mg creat   O-Desmethyltramadol            >  5952        EXPECTED   ng/mg creat   N-Desmethyltramadol            4495         EXPECTED   ng/mg creat    Source of tramadol is a prescription medication.    O-desmethyltramadol and N-desmethyltramadol are expected     metabolites of tramadol. Drug Absent but Declared for Prescription Verification   Diazepam                       Not Detected UNEXPECTED ng/mg creat ==================================================================== Test                      Result    Flag   Units      Ref Range   Creatinine              84               mg/dL      >=20 ==================================================================== Declared Medications:  The flagging and interpretation on this report are based on the  following declared medications.  Unexpected results may arise from  inaccuracies in the declared medications.  **Note: The testing scope of this panel includes these medications:  Diazepam  Tramadol  **Note: The testing scope of this panel does not include following  reported medications:  Amlodipine Besylate  Duloxetine  Ibuprofen ==================================================================== For clinical consultation, please call [phone]. ====================================================================    Laboratory Chemistry Profile (12 mo)  Renal: 03/31/2019: BUN 9; Creatinine, Ser 0.57  Lab Results  Component Value Date   GFRAA >60 03/31/2019   GFRNONAA >60 03/31/2019   Hepatic: 03/31/2019: Albumin 4.8 Lab Results  Component Value Date   AST 28 03/31/2019   ALT 37 03/31/2019   Other: 03/31/2019: CRP 1.4; Sed Rate 20; Vit D, 25-Hydroxy 22.1; Vitamin B-12 213 Note: Above Lab results reviewed.  Imaging  DG Shoulder Left Portable CLINICAL DATA:  Shoulder dislocation  EXAM: LEFT SHOULDER - 1 VIEW  COMPARISON:  Left shoulder radiograph earlier the same day.  FINDINGS: Interval reduction of left glenohumeral dislocation. No fracture visualized.  IMPRESSION: Reduction of left glenohumeral dislocation, now in anatomic alignment.  Electronically Signed   By: Ulyses Jarred M.D.   On: 10/31/2016 06:26 DG Shoulder Left CLINICAL DATA:  Shoulder  dislocation  EXAM: LEFT SHOULDER - 2+ VIEW  COMPARISON:  None.  FINDINGS: There is anterior dislocation of the left glenohumeral joint. No fracture identified.  IMPRESSION: Anterior left glenohumeral dislocation.  Electronically Signed   By: Ulyses Jarred M.D.   On: 10/31/2016 06:00   Assessment  Diagnoses of Chronic pain syndrome and Vitamin D insufficiency were pertinent to this visit.  Plan of Care  Problem-specific:  No problem-specific Assessment & Plan notes found for this encounter.  I am having Maclovia Bose maintain her ibuprofen, amLODipine, atenolol, erythromycin, diazepam, DULoxetine, traMADol, calcium carbonate, and Vitamin D3.  Pharmacotherapy (Medications Ordered): Meds ordered this encounter  Medications  . DULoxetine (CYMBALTA) 60 MG capsule    Sig: Take 1 capsule (60 mg total) by mouth daily.    Dispense:  90 capsule    Refill:  1    Fill one day early if pharmacy is closed on scheduled refill date. May substitute for generic if available.  . traMADol (ULTRAM) 50 MG tablet    Sig: Take 2 tablets (100 mg total) by mouth every 6 (six) hours as needed.  Dispense:  240 tablet    Refill:  5    Chronic Pain: STOP Act (Not applicable) Fill 1 day early if closed on refill date. Do not fill until: 09/25/2019. To last until: 03/23/2020. Avoid benzodiazepines within 8 hours of opioids  . calcium carbonate (CALCIUM 600) 600 MG TABS tablet    Sig: Take 1 tablet (600 mg total) by mouth 2 (two) times daily with a meal.    Dispense:  60 tablet    Refill:  11    Fill one day early if pharmacy is closed on scheduled refill date. May substitute for generic if available.  . Cholecalciferol (VITAMIN D3) 125 MCG (5000 UT) CAPS    Sig: Take 1 capsule (5,000 Units total) by mouth daily with breakfast. Take along with calcium and magnesium.    Dispense:  30 capsule    Refill:  11    Fill one day early if pharmacy is closed on scheduled refill date. May substitute for  generic if available. May substitute with similar over-the-counter product.   Orders:  No orders of the defined types were placed in this encounter.  Follow-up plan:   Return in about 28 weeks (around 03/22/2020) for (F2F), (MM).      Considering: Diagnostic bilateral lumbar facet block  Diagnostic caudal epidural steroid injection + epidurogram for the lower extremity pain   Palliative PRN treatment(s): Palliativebilateral lumbar facet block  Palliativecaudal epidural steroid injection + epidurogram for the lower extremity pain.     Recent Visits No visits were found meeting these conditions.  Showing recent visits within past 90 days and meeting all other requirements   Today's Visits Date Type Provider Dept  09/08/19 Telemedicine Milinda Pointer, MD [facility]  Showing today's visits and meeting all other requirements   Future Appointments No visits were found meeting these conditions.  Showing future appointments within next 90 days and meeting all other requirements   I discussed the assessment and treatment plan with the patient. The patient was provided an opportunity to ask questions and all were answered. The patient agreed with the plan and demonstrated an understanding of the instructions.  Patient advised to call back or seek an in-person evaluation if the symptoms or condition worsens.  Total duration of non-face-to-face encounter: 12 minutes.  Note by: Gaspar Cola, MD Date: 09/08/2019; Time: 11:34 AM

## 2019-09-08 ENCOUNTER — Ambulatory Visit: Payer: Worker's Compensation | Attending: Pain Medicine | Admitting: Pain Medicine

## 2019-09-08 ENCOUNTER — Other Ambulatory Visit: Payer: Self-pay

## 2019-09-08 DIAGNOSIS — G894 Chronic pain syndrome: Secondary | ICD-10-CM

## 2019-09-08 DIAGNOSIS — E559 Vitamin D deficiency, unspecified: Secondary | ICD-10-CM

## 2019-09-08 MED ORDER — DULOXETINE HCL 60 MG PO CPEP: 60.0000 mg | ORAL_CAPSULE | Freq: Every day | ORAL | 1 refills | Status: AC

## 2019-09-08 MED ORDER — TRAMADOL HCL 50 MG PO TABS: 100.0000 mg | ORAL_TABLET | Freq: Four times a day (QID) | ORAL | 5 refills | Status: DC | PRN

## 2019-09-08 MED ORDER — CALCIUM CARBONATE 600 MG PO TABS: 600.0000 mg | ORAL_TABLET | Freq: Two times a day (BID) | ORAL | 11 refills | Status: DC

## 2019-09-08 MED ORDER — VITAMIN D3 125 MCG (5000 UT) PO CAPS: 1.0000 | ORAL_CAPSULE | Freq: Every day | ORAL | 11 refills | Status: DC

## 2019-10-07 ENCOUNTER — Telehealth: Payer: Self-pay

## 2019-10-07 NOTE — Telephone Encounter (Signed)
Confirmed virtual visit with patient. klh 

## 2019-10-08 ENCOUNTER — Other Ambulatory Visit: Payer: Self-pay

## 2019-10-08 ENCOUNTER — Encounter: Payer: Self-pay | Admitting: Adult Health

## 2019-10-08 ENCOUNTER — Telehealth: Payer: Self-pay

## 2019-10-08 ENCOUNTER — Ambulatory Visit: Payer: Self-pay | Admitting: Adult Health

## 2019-10-08 VITALS — BP 134/82 | HR 100 | Temp 98.0°F | Ht 67.0 in | Wt 200.0 lb

## 2019-10-08 DIAGNOSIS — F17210 Nicotine dependence, cigarettes, uncomplicated: Secondary | ICD-10-CM

## 2019-10-08 DIAGNOSIS — R059 Cough, unspecified: Secondary | ICD-10-CM

## 2019-10-08 DIAGNOSIS — J449 Chronic obstructive pulmonary disease, unspecified: Secondary | ICD-10-CM

## 2019-10-08 DIAGNOSIS — R05 Cough: Secondary | ICD-10-CM

## 2019-10-08 DIAGNOSIS — I10 Essential (primary) hypertension: Secondary | ICD-10-CM

## 2019-10-08 MED ORDER — ALBUTEROL SULFATE HFA 108 (90 BASE) MCG/ACT IN AERS: 2.0000 | INHALATION_SPRAY | Freq: Four times a day (QID) | RESPIRATORY_TRACT | 2 refills | Status: AC | PRN

## 2019-10-08 MED ORDER — FLUTICASONE-SALMETEROL 250-50 MCG/DOSE IN AEPB: 1.0000 | INHALATION_SPRAY | Freq: Two times a day (BID) | RESPIRATORY_TRACT | 3 refills | Status: AC

## 2019-10-08 NOTE — Telephone Encounter (Signed)
Called to patient to let her know that we do not handle any workers comp PA for medications.  No answer and no voicemail capability

## 2019-10-08 NOTE — Progress Notes (Signed)
[facility] [place], [place] [address]  Internal MEDICINE  Telephone Visit  Patient Name: Shawna Carrillo  [id number]  [id number]  Date of Service: 10/08/2019  I connected with the patient at 945 by telephone and verified the patients identity using two identifiers.   I discussed the limitations, risks, security and privacy concerns of performing an evaluation and management service by telephone and the availability of in person appointments. I also discussed with the patient that there may be a patient responsible charge related to the service.  The patient expressed understanding and agrees to proceed.    Chief Complaint  Patient presents with  . Telephone Assessment  . Telephone Screen  . Cough    HAS BEEEN GOING ON FOR A MONTH, CLEAR PHLEGM  . Wheezing  . Nasal Congestion    HPI  Pt seen via video. She reports wheezing and coughing for about a month.  She is a smoker and feels like that is the cause.  She denies fever.  She reports she is in the process of cutting down on smoking.  She has been smoking over 30 years at least a pack per day.  She is down to 3 cigarettes daily at this time.  She reports she is able to get some mucous up when coughing.  It is generally clear, and not a large amount. The wheezing feels like it is in her upper airway.     Current Medication: Outpatient Encounter Medications as of 10/08/2019  Medication Sig Note  . amLODipine (NORVASC) 10 MG tablet Take 1 tablet (10 mg total) by mouth daily.   Marland Kitchen atenolol (TENORMIN) 25 MG tablet Take 1 tablet (25 mg total) by mouth daily.   Derrill Memo ON 11/05/2019] calcium carbonate (CALCIUM 600) 600 MG TABS tablet Take 1 tablet (600 mg total) by mouth 2 (two) times daily with a meal.   . [START ON 11/05/2019] Cholecalciferol (VITAMIN D3) 125 MCG (5000 UT) CAPS Take 1 capsule (5,000 Units total) by mouth daily with breakfast. Take along with calcium and magnesium.   . diazepam (VALIUM) 5 MG tablet  TAKE 1 TABLET BY MOUTH AT BEDTIME AS NEEDED FOR ANXIETY OR MUSCLE SPASMS   . DULoxetine (CYMBALTA) 60 MG capsule Take 1 capsule (60 mg total) by mouth daily.   Marland Kitchen erythromycin ophthalmic ointment Place 1 application into the left eye 4 (four) times daily.   Marland Kitchen ibuprofen (ADVIL,MOTRIN) 200 MG tablet Take 800 mg by mouth as needed for mild pain.   . traMADol (ULTRAM) 50 MG tablet Take 2 tablets (100 mg total) by mouth every 6 (six) hours as needed. 09/08/2019: Future Prescription, NOT DUPLICATES. >>>DO NOT DELETE, even if Expired!!!<<< See Care Coordination Note from [facility] (Dr. Dossie Arbour)    . albuterol (VENTOLIN HFA) 108 (90 Base) MCG/ACT inhaler Inhale 2 puffs into the lungs every 6 (six) hours as needed for wheezing or shortness of breath.   . Fluticasone-Salmeterol (ADVAIR DISKUS) 250-50 MCG/DOSE AEPB Inhale 1 puff into the lungs 2 (two) times daily.    No facility-administered encounter medications on file as of 10/08/2019.    Surgical History: Past Surgical History:  Procedure Laterality Date  . hilar myectomy    . LUMBAR FUSION  2012   FAILED    Medical History: Past Medical History:  Diagnosis Date  . B12 deficiency   . History of anemia 07/12/2015  . History of migraine 07/12/2015  . History of peptic ulcer disease 07/12/2015  . Insomnia  Family History: Family History  Problem Relation Age of Onset  . Diabetes Mother   . Heart disease Father     Social History   Socioeconomic History  . Marital status: Married    Spouse name: Not on file  . Number of children: Not on file  . Years of education: Not on file  . Highest education level: Not on file  Occupational History  . Not on file  Tobacco Use  . Smoking status: Current Every Day Smoker    Packs/day: 1.00    Types: Cigarettes  . Smokeless tobacco: Never Used  . Tobacco comment: DOWN TO 4-5 CIGARETTES, SOME DAYS NONE- REPORTED ON 10/08/2019  Substance and Sexual Activity  . Alcohol use: No     Alcohol/week: 0.0 standard drinks  . Drug use: No  . Sexual activity: Not on file  Other Topics Concern  . Not on file  Social History Narrative  . Not on file   Social Determinants of Health   Financial Resource Strain:   . Difficulty of Paying Living Expenses: Not on file  Food Insecurity:   . Worried About Charity fundraiser in the Last Year: Not on file  . Ran Out of Food in the Last Year: Not on file  Transportation Needs:   . Lack of Transportation (Medical): Not on file  . Lack of Transportation (Non-Medical): Not on file  Physical Activity:   . Days of Exercise per Week: Not on file  . Minutes of Exercise per Session: Not on file  Stress:   . Feeling of Stress : Not on file  Social Connections:   . Frequency of Communication with Friends and Family: Not on file  . Frequency of Social Gatherings with Friends and Family: Not on file  . Attends Religious Services: Not on file  . Active Member of Clubs or Organizations: Not on file  . Attends Archivist Meetings: Not on file  . Marital Status: Not on file  Intimate Partner Violence:   . Fear of Current or Ex-Partner: Not on file  . Emotionally Abused: Not on file  . Physically Abused: Not on file  . Sexually Abused: Not on file      Review of Systems  Constitutional: Negative for chills, fatigue and unexpected weight change.  HENT: Negative for congestion, rhinorrhea, sneezing and sore throat.   Eyes: Negative for photophobia, pain and redness.  Respiratory: Positive for cough and wheezing. Negative for chest tightness and shortness of breath.   Cardiovascular: Negative for chest pain and palpitations.  Gastrointestinal: Negative for abdominal pain, constipation, diarrhea, nausea and vomiting.  Endocrine: Negative.   Genitourinary: Negative for dysuria and frequency.  Musculoskeletal: Negative for arthralgias, back pain, joint swelling and neck pain.  Skin: Negative for rash.  Allergic/Immunologic:  Negative.   Neurological: Negative for tremors and numbness.  Hematological: Negative for adenopathy. Does not bruise/bleed easily.  Psychiatric/Behavioral: Negative for behavioral problems and sleep disturbance. The patient is not nervous/anxious.     Vital Signs: BP 134/82   Pulse 100   Temp 98 F (36.7 C)   Ht 5\' 7"  (1.702 m)   Wt 200 lb (90.7 kg)   SpO2 97%   BMI 31.32 kg/m    Observation/Objective:  Well sounding, NAD noted.    Assessment/Plan: 1. Chronic obstructive pulmonary disease, unspecified COPD type ([place]) Use advair daily as discussed, and albuterol as needed.  - Fluticasone-Salmeterol (ADVAIR DISKUS) 250-50 MCG/DOSE AEPB; Inhale 1 puff into the lungs 2 (  two) times daily.  Dispense: 1 each; Refill: 3 - albuterol (VENTOLIN HFA) 108 (90 Base) MCG/ACT inhaler; Inhale 2 puffs into the lungs every 6 (six) hours as needed for wheezing or shortness of breath.  Dispense: 18 g; Refill: 2  2. Cough Get CXR for cough/wheezing - DG Chest 2 View; Future  3. Essential hypertension Stable, continue current management.  4. Cigarette smoker Smoking cessation counseling: 1. Pt acknowledges the risks of long term smoking, she will try to quite smoking. 2. Options for different medications including nicotine products, chewing gum, patch etc, Wellbutrin and Chantix is discussed 3. Goal and date of compete cessation is discussed 4. Total time spent in smoking cessation is 15 min.   General Counseling: Bular verbalizes understanding of the findings of today's phone visit and agrees with plan of treatment. I have discussed any further diagnostic evaluation that may be needed or ordered today. We also reviewed her medications today. she has been encouraged to call the office with any questions or concerns that should arise related to todays visit.    Orders Placed This Encounter  Procedures  . DG Chest 2 View    Meds ordered this encounter  Medications  .  Fluticasone-Salmeterol (ADVAIR DISKUS) 250-50 MCG/DOSE AEPB    Sig: Inhale 1 puff into the lungs 2 (two) times daily.    Dispense:  1 each    Refill:  3  . albuterol (VENTOLIN HFA) 108 (90 Base) MCG/ACT inhaler    Sig: Inhale 2 puffs into the lungs every 6 (six) hours as needed for wheezing or shortness of breath.    Dispense:  18 g    Refill:  2    Time spent: [place] AGNP-C Internal medicine

## 2019-10-08 NOTE — Telephone Encounter (Signed)
She states her workers comp will need [place] to put in the request for prior auth for her narcotics  into a portal and the doctor should know what they are talking about. She has [place] workers comp. I told her I doubt that our doctors know what she is talking about so,  She found a number that you can call to do the prior auth.  [phone].Marland Kitchen She said its because they have new rules in [place] workers comp and they [place] narcotics without doing the prior [place].

## 2019-10-09 ENCOUNTER — Ambulatory Visit
Admission: RE | Admit: 2019-10-09 | Discharge: 2019-10-09 | Disposition: A | Discharge status: Home / Self Care | Payer: Self-pay | Source: Ambulatory Visit | Attending: Adult Health | Admitting: Adult Health

## 2019-10-09 ENCOUNTER — Other Ambulatory Visit: Payer: Self-pay | Admitting: Adult Health

## 2019-10-09 ENCOUNTER — Telehealth: Payer: Self-pay

## 2019-10-09 DIAGNOSIS — R059 Cough, unspecified: Secondary | ICD-10-CM

## 2019-10-09 DIAGNOSIS — R05 Cough: Secondary | ICD-10-CM

## 2019-10-09 MED ORDER — LEVOFLOXACIN 500 MG PO TABS: 500.0000 mg | ORAL_TABLET | Freq: Every day | ORAL | 0 refills | Status: AC

## 2019-10-09 NOTE — Telephone Encounter (Signed)
Pt advised chest xray showed pneumonia we send levaquin and continue as prescribed

## 2019-10-09 NOTE — Progress Notes (Signed)
Sent levaquin to pharmacy for PNA on CXR.

## 2019-10-09 NOTE — Telephone Encounter (Signed)
Sent message to [employer].

## 2019-10-16 NOTE — Progress Notes (Signed)
Pt needs further work up, [employer]

## 2019-10-20 ENCOUNTER — Telehealth: Payer: Self-pay

## 2019-10-20 NOTE — Telephone Encounter (Signed)
Called lmom informing patient of appointment on 10/22/2019. klh

## 2019-10-20 NOTE — Telephone Encounter (Signed)
Confirmed virtual visit on 10/22/2019 with patient. klh

## 2019-10-22 ENCOUNTER — Ambulatory Visit: Payer: Self-pay | Admitting: Adult Health

## 2019-10-22 ENCOUNTER — Ambulatory Visit: Payer: Self-pay | Admitting: Internal Medicine

## 2019-10-22 ENCOUNTER — Encounter: Payer: Self-pay | Admitting: Adult Health

## 2019-10-22 VITALS — BP 140/96 | HR 77 | Temp 98.4°F | Ht 67.0 in | Wt 198.0 lb

## 2019-10-22 DIAGNOSIS — J189 Pneumonia, unspecified organism: Secondary | ICD-10-CM

## 2019-10-22 DIAGNOSIS — R05 Cough: Secondary | ICD-10-CM

## 2019-10-22 DIAGNOSIS — R059 Cough, unspecified: Secondary | ICD-10-CM

## 2019-10-22 DIAGNOSIS — J449 Chronic obstructive pulmonary disease, unspecified: Secondary | ICD-10-CM

## 2019-10-22 DIAGNOSIS — I10 Essential (primary) hypertension: Secondary | ICD-10-CM

## 2019-10-22 MED ORDER — ATENOLOL 25 MG PO TABS: 25.0000 mg | ORAL_TABLET | Freq: Two times a day (BID) | ORAL | 1 refills | Status: DC

## 2019-10-22 NOTE — Progress Notes (Signed)
[facility] [place], [place] [address]  Internal MEDICINE  Telephone Visit  Patient Name: Shawna Carrillo  [id number]  [id number]  Date of Service: 10/22/2019  I connected with the patient at 1012 by telephone and verified the patients identity using two identifiers.   I discussed the limitations, risks, security and privacy concerns of performing an evaluation and management service by telephone and the availability of in person appointments. I also discussed with the patient that there may be a patient responsible charge related to the service.  The patient expressed understanding and agrees to proceed.    Chief Complaint  Patient presents with  . Telephone Assessment  . Telephone Screen  . Follow-up    REVIEW CHEST XRAY     HPI  PT seen via video today.  She has completed her Levaquin and reports she is feeling much better.  She continues to have some coughing and wheezing at times. She continues to use her inhalers and feels like she is gettting better.  Will repeat her CXR in 2 weeks to make sure PNA has resolved, and to evaluate areas there were unable to be seen at last cxr.      Current Medication: Outpatient Encounter Medications as of 10/22/2019  Medication Sig Note  . albuterol (VENTOLIN HFA) 108 (90 Base) MCG/ACT inhaler Inhale 2 puffs into the lungs every 6 (six) hours as needed for wheezing or shortness of breath.   Marland Kitchen amLODipine (NORVASC) 10 MG tablet Take 1 tablet (10 mg total) by mouth daily.   Marland Kitchen atenolol (TENORMIN) 25 MG tablet Take 1 tablet (25 mg total) by mouth 2 (two) times daily.   Derrill Memo ON 11/05/2019] calcium carbonate (CALCIUM 600) 600 MG TABS tablet Take 1 tablet (600 mg total) by mouth 2 (two) times daily with a meal.   . [START ON 11/05/2019] Cholecalciferol (VITAMIN D3) 125 MCG (5000 UT) CAPS Take 1 capsule (5,000 Units total) by mouth daily with breakfast. Take along with calcium and magnesium.   . diazepam (VALIUM) 5 MG tablet  TAKE 1 TABLET BY MOUTH AT BEDTIME AS NEEDED FOR ANXIETY OR MUSCLE SPASMS   . DULoxetine (CYMBALTA) 60 MG capsule Take 1 capsule (60 mg total) by mouth daily.   Marland Kitchen erythromycin ophthalmic ointment Place 1 application into the left eye 4 (four) times daily.   . Fluticasone-Salmeterol (ADVAIR DISKUS) 250-50 MCG/DOSE AEPB Inhale 1 puff into the lungs 2 (two) times daily.   Marland Kitchen ibuprofen (ADVIL,MOTRIN) 200 MG tablet Take 800 mg by mouth as needed for mild pain.   . traMADol (ULTRAM) 50 MG tablet Take 2 tablets (100 mg total) by mouth every 6 (six) hours as needed. 09/08/2019: Future Prescription, NOT DUPLICATES. >>>DO NOT DELETE, even if Expired!!!<<< See Care Coordination Note from [facility] (Dr. Dossie Arbour)    . [DISCONTINUED] atenolol (TENORMIN) 25 MG tablet Take 1 tablet (25 mg total) by mouth daily.    No facility-administered encounter medications on file as of 10/22/2019.    Surgical History: Past Surgical History:  Procedure Laterality Date  . hilar myectomy    . LUMBAR FUSION  2012   FAILED    Medical History: Past Medical History:  Diagnosis Date  . B12 deficiency   . History of anemia 07/12/2015  . History of migraine 07/12/2015  . History of peptic ulcer disease 07/12/2015  . Insomnia     Family History: Family History  Problem Relation Age of Onset  . Diabetes Mother   .  Heart disease Father     Social History   Socioeconomic History  . Marital status: Married    Spouse name: Not on file  . Number of children: Not on file  . Years of education: Not on file  . Highest education level: Not on file  Occupational History  . Not on file  Tobacco Use  . Smoking status: Former Smoker    Packs/day: 1.00    Types: Cigarettes  . Smokeless tobacco: Never Used  . Tobacco comment: DOES NOT SMOKE ANYMORE REPORTED ON 10/22/19  Substance and Sexual Activity  . Alcohol use: No    Alcohol/week: 0.0 standard drinks  . Drug use: No  . Sexual activity: Not on file   Other Topics Concern  . Not on file  Social History Narrative  . Not on file   Social Determinants of Health   Financial Resource Strain:   . Difficulty of Paying Living Expenses: Not on file  Food Insecurity:   . Worried About Charity fundraiser in the Last Year: Not on file  . Ran Out of Food in the Last Year: Not on file  Transportation Needs:   . Lack of Transportation (Medical): Not on file  . Lack of Transportation (Non-Medical): Not on file  Physical Activity:   . Days of Exercise per Week: Not on file  . Minutes of Exercise per Session: Not on file  Stress:   . Feeling of Stress : Not on file  Social Connections:   . Frequency of Communication with Friends and Family: Not on file  . Frequency of Social Gatherings with Friends and Family: Not on file  . Attends Religious Services: Not on file  . Active Member of Clubs or Organizations: Not on file  . Attends Archivist Meetings: Not on file  . Marital Status: Not on file  Intimate Partner Violence:   . Fear of Current or Ex-Partner: Not on file  . Emotionally Abused: Not on file  . Physically Abused: Not on file  . Sexually Abused: Not on file      Review of Systems  Constitutional: Negative for chills, fatigue and unexpected weight change.  HENT: Negative for congestion, rhinorrhea, sneezing and sore throat.   Eyes: Negative for photophobia, pain and redness.  Respiratory: Negative for cough, chest tightness and shortness of breath.   Cardiovascular: Negative for chest pain and palpitations.  Gastrointestinal: Negative for abdominal pain, constipation, diarrhea, nausea and vomiting.  Endocrine: Negative.   Genitourinary: Negative for dysuria and frequency.  Musculoskeletal: Negative for arthralgias, back pain, joint swelling and neck pain.  Skin: Negative for rash.  Allergic/Immunologic: Negative.   Neurological: Negative for tremors and numbness.  Hematological: Negative for adenopathy. Does not  bruise/bleed easily.  Psychiatric/Behavioral: Negative for behavioral problems and sleep disturbance. The patient is not nervous/anxious.     Vital Signs: BP (!) 140/96   Pulse 77   Temp 98.4 F (36.9 C)   Ht 5\' 7"  (1.702 m)   Wt 198 lb (89.8 kg)   SpO2 97%   BMI 31.01 kg/m    Observation/Objective:  Well appearing, NAD noted   Assessment/Plan: 1. Pneumonia of right upper lobe due to infectious organism Symptoms are resolving, Will repeat cxr in 2 weeks per radiology recommendations for pna resolution.  - DG Chest 2 View; Future  2. Chronic obstructive pulmonary disease, unspecified COPD type ([place]) Stable, patient has stopped smoking at this time. Encouraged continues compliance with that.   3. Cough  Lingering, but improved.  4. Essential hypertension Increased dose of atenolol to 25 BID. Was once daily.  - atenolol (TENORMIN) 25 MG tablet; Take 1 tablet (25 mg total) by mouth 2 (two) times daily.  Dispense: 180 tablet; Refill: 1  General Counseling: Emryn verbalizes understanding of the findings of today's phone visit and agrees with plan of treatment. I have discussed any further diagnostic evaluation that may be needed or ordered today. We also reviewed her medications today. she has been encouraged to call the office with any questions or concerns that should arise related to todays visit.    Orders Placed This Encounter  Procedures  . DG Chest 2 View    Meds ordered this encounter  Medications  . atenolol (TENORMIN) 25 MG tablet    Sig: Take 1 tablet (25 mg total) by mouth 2 (two) times daily.    Dispense:  180 tablet    Refill:  1    Dose change, to BID    Time spent: [place] AGNP-C Internal medicine

## 2019-10-27 ENCOUNTER — Ambulatory Visit: Payer: Self-pay | Admitting: Nurse Practitioner

## 2019-10-27 ENCOUNTER — Telehealth: Payer: Self-pay

## 2019-10-27 NOTE — Telephone Encounter (Signed)
Pt called that she having sore throat,coughing no fever and no taste as per adam advised pt take OTC mucinex and Tylenol and also need to go for covid test and call us back with result and if symptoms worse call us back

## 2019-10-31 ENCOUNTER — Telehealth: Payer: Self-pay

## 2019-10-31 NOTE — Telephone Encounter (Signed)
Send message

## 2019-11-03 ENCOUNTER — Other Ambulatory Visit: Payer: Self-pay

## 2019-11-03 ENCOUNTER — Ambulatory Visit
Admission: RE | Admit: 2019-11-03 | Discharge: 2019-11-03 | Disposition: A | Discharge status: Home / Self Care | Payer: Self-pay | Source: Ambulatory Visit | Attending: Adult Health | Admitting: Adult Health

## 2019-11-03 ENCOUNTER — Ambulatory Visit
Admission: RE | Admit: 2019-11-03 | Discharge: 2019-11-03 | Disposition: A | Discharge status: Home / Self Care | Payer: Self-pay | Source: Home / Self Care | Attending: Adult Health | Admitting: Adult Health

## 2019-11-03 DIAGNOSIS — J189 Pneumonia, unspecified organism: Secondary | ICD-10-CM | POA: Insufficient documentation

## 2019-11-04 ENCOUNTER — Other Ambulatory Visit: Payer: Self-pay | Admitting: Adult Health

## 2019-11-04 DIAGNOSIS — R05 Cough: Secondary | ICD-10-CM

## 2019-11-04 DIAGNOSIS — R059 Cough, unspecified: Secondary | ICD-10-CM

## 2019-11-04 MED ORDER — GUAIFENESIN-CODEINE 100-10 MG/5ML PO SOLN: 5.0000 mL | Freq: Every evening | ORAL | 0 refills | Status: DC | PRN

## 2019-11-04 NOTE — Progress Notes (Signed)
Pt CXR reviewed with her.  She continues to have cough day and night. Sent RX for codeine cough syrup.  Discussed need for chest Ct due to density on CXR.  Patient verbalized understanding, however does not have insurance. Looking for ways to assist her at this time.

## 2019-11-10 ENCOUNTER — Telehealth: Payer: Self-pay

## 2019-11-10 NOTE — Telephone Encounter (Signed)
Confirmed appointment on 11/12/2019 and screened for covid. klh

## 2019-11-11 ENCOUNTER — Telehealth: Payer: Self-pay

## 2019-11-11 ENCOUNTER — Other Ambulatory Visit: Payer: Self-pay | Admitting: Adult Health

## 2019-11-11 DIAGNOSIS — R059 Cough, unspecified: Secondary | ICD-10-CM

## 2019-11-11 DIAGNOSIS — R05 Cough: Secondary | ICD-10-CM

## 2019-11-11 NOTE — Telephone Encounter (Signed)
Called patient and left a message and asked her to call back, so we can discuss ct scan and reschedule follow up with provider. Shawna Carrillo

## 2019-11-12 ENCOUNTER — Ambulatory Visit: Payer: Self-pay | Admitting: Adult Health

## 2019-11-12 ENCOUNTER — Other Ambulatory Visit: Payer: Self-pay

## 2019-11-18 ENCOUNTER — Ambulatory Visit
Admission: RE | Admit: 2019-11-18 | Discharge: 2019-11-18 | Disposition: A | Discharge status: Home / Self Care | Payer: Self-pay | Source: Ambulatory Visit | Attending: Adult Health | Admitting: Adult Health

## 2019-11-18 ENCOUNTER — Other Ambulatory Visit: Payer: Self-pay

## 2019-11-18 DIAGNOSIS — R05 Cough: Secondary | ICD-10-CM | POA: Insufficient documentation

## 2019-11-18 DIAGNOSIS — R059 Cough, unspecified: Secondary | ICD-10-CM

## 2019-11-18 HISTORY — DX: Essential (primary) hypertension: I10

## 2019-11-18 LAB — POCT I-STAT CREATININE: Creatinine, Ser: 0.6 mg/dL (ref 0.44–1.00)

## 2019-11-18 MED ORDER — IOHEXOL 300 MG/ML  SOLN
75.0000 mL | Freq: Once | INTRAMUSCULAR | Status: AC | PRN
  Administered 2019-11-18: 15:00:00 75 mL via INTRAVENOUS

## 2019-11-23 ENCOUNTER — Other Ambulatory Visit: Payer: Self-pay | Admitting: Internal Medicine

## 2019-11-23 NOTE — Progress Notes (Signed)
Needs follow-up

## 2019-11-24 ENCOUNTER — Telehealth: Payer: Self-pay

## 2019-11-24 NOTE — Telephone Encounter (Signed)
Called lmom informing patient of appointment on 11/26/2019. klh °

## 2019-11-26 ENCOUNTER — Ambulatory Visit: Payer: Self-pay | Admitting: Adult Health

## 2019-12-03 DIAGNOSIS — Z981 Arthrodesis status: Secondary | ICD-10-CM | POA: Insufficient documentation

## 2019-12-05 ENCOUNTER — Other Ambulatory Visit: Payer: Self-pay | Admitting: Adult Health

## 2019-12-05 DIAGNOSIS — R059 Cough, unspecified: Secondary | ICD-10-CM

## 2019-12-05 DIAGNOSIS — R05 Cough: Secondary | ICD-10-CM

## 2019-12-05 MED ORDER — GUAIFENESIN-CODEINE 100-10 MG/5ML PO SOLN: 5.0000 mL | Freq: Every evening | ORAL | 0 refills | Status: DC | PRN

## 2019-12-05 NOTE — Progress Notes (Signed)
Sent RX for codeine cough syrup.

## 2019-12-16 DIAGNOSIS — C3401 Malignant neoplasm of right main bronchus: Secondary | ICD-10-CM | POA: Insufficient documentation

## 2020-01-09 ENCOUNTER — Telehealth: Payer: Self-pay

## 2020-01-09 NOTE — Telephone Encounter (Signed)
Confirmed and screened for 01-13-20 ov.

## 2020-01-13 ENCOUNTER — Other Ambulatory Visit: Payer: Self-pay

## 2020-01-13 ENCOUNTER — Encounter: Payer: Self-pay | Admitting: Nurse Practitioner

## 2020-01-13 ENCOUNTER — Ambulatory Visit: Payer: Self-pay | Admitting: Nurse Practitioner

## 2020-01-13 ENCOUNTER — Other Ambulatory Visit: Payer: Self-pay | Admitting: Adult Health

## 2020-01-13 VITALS — BP 141/89 | HR 78 | Temp 97.4°F | Resp 16 | Ht 67.0 in | Wt 195.2 lb

## 2020-01-13 DIAGNOSIS — R059 Cough, unspecified: Secondary | ICD-10-CM

## 2020-01-13 DIAGNOSIS — L02611 Cutaneous abscess of right foot: Secondary | ICD-10-CM

## 2020-01-13 DIAGNOSIS — R05 Cough: Secondary | ICD-10-CM

## 2020-01-13 DIAGNOSIS — M62838 Other muscle spasm: Secondary | ICD-10-CM

## 2020-01-13 MED ORDER — GUAIFENESIN-CODEINE 100-10 MG/5ML PO SOLN: 5.0000 mL | Freq: Every evening | ORAL | 0 refills | Status: AC | PRN

## 2020-01-13 MED ORDER — CEPHALEXIN 500 MG PO CAPS: 500.0000 mg | ORAL_CAPSULE | Freq: Three times a day (TID) | ORAL | 0 refills | Status: DC

## 2020-01-13 MED ORDER — DIAZEPAM 5 MG PO TABS: ORAL_TABLET | ORAL | 3 refills | Status: DC

## 2020-01-13 NOTE — Progress Notes (Signed)
[facility] [place], [place] [address]  Internal MEDICINE  Office Visit Note  Patient Name: Shawna Carrillo  [id number]  [id number]  Date of Service: 01/25/2020  Chief Complaint  Patient presents with  . Blister    BLISTER ON RIGHT SIDE OF FOOT   . Medication Refill    guaifesin with codeine, valium      The patient Is here for sick visit. Open, fluid filled blister to bottom and lateral aspect of right foot which may need debridement.  This is mildly tender. She is due to start [facility] therapy for lung cancer tomorrow and there may be some contraindications regarding this infection and starting [place].   Pt is here for a sick visit.     Current Medication:  Outpatient Encounter Medications as of 01/13/2020  Medication Sig Note  . albuterol (VENTOLIN HFA) 108 (90 Base) MCG/ACT inhaler Inhale 2 puffs into the lungs every 6 (six) hours as needed for wheezing or shortness of breath.   Marland Kitchen amLODipine (NORVASC) 10 MG tablet Take 1 tablet (10 mg total) by mouth daily.   Marland Kitchen atenolol (TENORMIN) 25 MG tablet Take 1 tablet (25 mg total) by mouth 2 (two) times daily.   . benzonatate (TESSALON) 100 MG capsule Take by mouth 3 (three) times daily as needed for cough.   . calcium carbonate (CALCIUM 600) 600 MG TABS tablet Take 1 tablet (600 mg total) by mouth 2 (two) times daily with a meal.   . Cholecalciferol (VITAMIN D3) 125 MCG (5000 UT) CAPS Take 1 capsule (5,000 Units total) by mouth daily with breakfast. Take along with calcium and magnesium.   . DULoxetine (CYMBALTA) 60 MG capsule Take 1 capsule (60 mg total) by mouth daily.   Marland Kitchen erythromycin ophthalmic ointment Place 1 application into the left eye 4 (four) times daily.   . Fluticasone-Salmeterol (ADVAIR DISKUS) 250-50 MCG/DOSE AEPB Inhale 1 puff into the lungs 2 (two) times daily.   Marland Kitchen guaiFENesin-codeine 100-10 MG/5ML syrup Take 5 mLs by mouth at bedtime as needed for cough.   Marland Kitchen ibuprofen (ADVIL,MOTRIN) 200  MG tablet Take 800 mg by mouth as needed for mild pain.   . traMADol (ULTRAM) 50 MG tablet Take 2 tablets (100 mg total) by mouth every 6 (six) hours as needed. 09/08/2019: Future Prescription, NOT DUPLICATES. >>>DO NOT DELETE, even if Expired!!!<<< See Care Coordination Note from [facility] (Dr. Dossie Arbour)    . [DISCONTINUED] diazepam (VALIUM) 5 MG tablet TAKE 1 TABLET BY MOUTH AT BEDTIME AS NEEDED FOR ANXIETY OR MUSCLE SPASMS   . [DISCONTINUED] guaiFENesin-codeine 100-10 MG/5ML syrup Take 5 mLs by mouth at bedtime as needed for cough.   . cephALEXin (KEFLEX) 500 MG capsule Take 1 capsule (500 mg total) by mouth 3 (three) times daily.    No facility-administered encounter medications on file as of 01/13/2020.      Medical History: Past Medical History:  Diagnosis Date  . B12 deficiency   . History of anemia 07/12/2015  . History of migraine 07/12/2015  . History of peptic ulcer disease 07/12/2015  . Hypertension   . Insomnia      Today's Vitals   01/13/20 1053  BP: (!) 141/89  Pulse: 78  Resp: 16  Temp: (!) 97.4 F (36.3 C)  SpO2: 96%  Weight: 195 lb 3.2 oz (88.5 kg)  Height: 5\' 7"  (1.702 m)   Body mass index is 30.57 kg/m.  Review of Systems  Constitutional: Positive for fatigue. Negative for chills,  fever and unexpected weight change.  HENT: Negative for congestion, postnasal drip, rhinorrhea, sneezing and sore throat.   Respiratory: Positive for shortness of breath. Negative for cough and chest tightness.   Cardiovascular: Negative for chest pain and palpitations.  Gastrointestinal: Negative for abdominal pain, constipation, diarrhea, nausea and vomiting.  Musculoskeletal: Negative for arthralgias, back pain, joint swelling and neck pain.  Skin: Positive for wound. Negative for rash.       Open, fluid filled blister to bottom and lateral aspect of right foot. This is tender and warm.   Allergic/Immunologic: Negative for environmental allergies.  Neurological:  Negative for dizziness, tremors, numbness and headaches.  Hematological: Negative for adenopathy. Does not bruise/bleed easily.  Psychiatric/Behavioral: Negative for behavioral problems (Depression), sleep disturbance and suicidal ideas. The patient is not nervous/anxious.     Physical Exam Vitals and nursing note reviewed.  Constitutional:      General: She is not in acute distress.    Appearance: Normal appearance. She is well-developed. She is not diaphoretic.  HENT:     Head: Normocephalic and atraumatic.     Mouth/Throat:     Pharynx: No oropharyngeal exudate.  Eyes:     Pupils: Pupils are equal, round, and reactive to light.  Neck:     Thyroid: No thyromegaly.     Vascular: No JVD.     Trachea: No tracheal deviation.  Cardiovascular:     Rate and Rhythm: Normal rate and regular rhythm.     Heart sounds: Normal heart sounds. No murmur. No friction rub. No gallop.   Pulmonary:     Effort: Pulmonary effort is normal. No respiratory distress.     Breath sounds: No wheezing or rales.     Comments: Diminished breath sounds throughout.  Chest:     Chest wall: No tenderness.  Abdominal:     Palpations: Abdomen is soft.  Musculoskeletal:        General: Normal range of motion.     Cervical back: Normal range of motion and neck supple.  Lymphadenopathy:     Cervical: No cervical adenopathy.  Skin:    General: Skin is warm and dry.     Comments: Open, fluid filled blister to bottom and lateral aspect of right foot  There is tenderness, warmth, and presence off cellulitis present. There is necrotic tissue present in center of wound. There is serosanguinous fluid present.   Neurological:     Mental Status: She is alert and oriented to person, place, and time.     Cranial Nerves: No cranial nerve deficit.  Psychiatric:        Attention and Perception: Attention and perception normal.        Mood and Affect: Mood is anxious and depressed.        Speech: Speech normal.         Behavior: Behavior normal.        Thought Content: Thought content normal.        Cognition and Memory: Cognition and memory normal.        Judgment: Judgment normal.   Assessment/Plan: 1. Abscess of foot without toes, right Start keflex 500mg  three times daily for next 10 days. Refer to wound clinic for further evaluation and treatment.  - cephALEXin (KEFLEX) 500 MG capsule; Take 1 capsule (500 mg total) by mouth 3 (three) times daily.  Dispense: 30 capsule; Refill: 0 - Ambulatory referral to Wound Clinic  2. Cough May take prescribed cough suppressant twice daily as needed. Refill  provided today.  - guaiFENesin-codeine 100-10 MG/5ML syrup; Take 5 mLs by mouth at bedtime as needed for cough.  Dispense: 180 mL; Refill: 0  General Counseling: Donyetta verbalizes understanding of the findings of todays visit and agrees with plan of treatment. I have discussed any further diagnostic evaluation that may be needed or ordered today. We also reviewed her medications today. she has been encouraged to call the office with any questions or concerns that should arise related to todays visit.    Counseling:  This patient was seen by Leretha Pol FNP Collaboration with Dr Lavera Guise as a part of collaborative care agreement  Orders Placed This Encounter  Procedures  . Ambulatory referral to Wound Clinic    Meds ordered this encounter  Medications  . cephALEXin (KEFLEX) 500 MG capsule    Sig: Take 1 capsule (500 mg total) by mouth 3 (three) times daily.    Dispense:  30 capsule    Refill:  0    Order Specific Question:   Supervising Provider    Answer:   Lavera Guise [[id number]]  . guaiFENesin-codeine 100-10 MG/5ML syrup    Sig: Take 5 mLs by mouth at bedtime as needed for cough.    Dispense:  180 mL    Refill:  0    Order Specific Question:   Supervising Provider    Answer:   Lavera Guise [[id number]]    Time spent: 30 Minutes

## 2020-01-13 NOTE — Progress Notes (Signed)
Pt valium refilled at this time.  She will need to be seen for further refills.

## 2020-01-19 ENCOUNTER — Encounter: Payer: Self-pay | Attending: Physician Assistant | Admitting: Physician Assistant

## 2020-01-19 ENCOUNTER — Other Ambulatory Visit: Payer: Self-pay

## 2020-01-19 DIAGNOSIS — Z87891 Personal history of nicotine dependence: Secondary | ICD-10-CM | POA: Insufficient documentation

## 2020-01-19 DIAGNOSIS — I10 Essential (primary) hypertension: Secondary | ICD-10-CM | POA: Insufficient documentation

## 2020-01-19 DIAGNOSIS — L97512 Non-pressure chronic ulcer of other part of right foot with fat layer exposed: Secondary | ICD-10-CM | POA: Insufficient documentation

## 2020-01-19 DIAGNOSIS — C3490 Malignant neoplasm of unspecified part of unspecified bronchus or lung: Secondary | ICD-10-CM | POA: Insufficient documentation

## 2020-01-19 NOTE — Progress Notes (Signed)
MARLYN, TONDREAU ([id number]) Visit Report for 01/19/2020 Abuse/Suicide Risk Screen Details Patient Name: Shawna Carrillo, Shawna Carrillo Date of Service: 01/19/2020 1:00 PM Medical Record Number: [id number] Patient Account Number: [id number] Date of Birth/Sex: [birth year] (59 y.o. F) Treating RN: Montey Hora Primary Care Ashleen Demma: Clayborn Bigness Other Clinician: Referring Naydeline Morace: Leretha Pol Treating Valerya Maxton/Extender: STONE III, HOYT Weeks in Treatment: 0 Abuse/Suicide Risk Screen Items Answer ABUSE RISK SCREEN: Has anyone close to you tried to hurt or harm you recentlyo No Do you feel uncomfortable with anyone in your familyo No Has anyone forced you do things that you didnot want to doo No Electronic Signature(s) Signed: 01/19/2020 4:26:48 PM By: Montey Hora Entered By: Montey Hora on 01/19/2020 13:05:19 Eusebio Me ([id number]) -------------------------------------------------------------------------------- Activities of Daily Living Details Patient Name: Eusebio Me Date of Service: 01/19/2020 1:00 PM Medical Record Number: [id number] Patient Account Number: [id number] Date of Birth/Sex: [birth year] (59 y.o. F) Treating RN: Montey Hora Primary Care Faiz Weber: Clayborn Bigness Other Clinician: Referring Cleave Ternes: Leretha Pol Treating Gaylyn Berish/Extender: Melburn Hake, HOYT Weeks in Treatment: 0 Activities of Daily Living Items Answer Activities of Daily Living (Please select one for each item) Drive Automobile Completely Able Take Medications Completely Able Use Telephone Completely Able Care for Appearance Completely Able Use Toilet Completely Able Bath / Shower Completely Able Dress Self Completely Able Feed Self Completely Able Walk Completely Able Get In / Out Bed Completely Able Housework Completely Able Prepare Meals Completely Sarita for Self Completely Able Electronic Signature(s) Signed: 01/19/2020 4:26:48 PM By: Montey Hora Entered By: Montey Hora on 01/19/2020 13:05:42 Eusebio Me ([id number]) -------------------------------------------------------------------------------- Education Screening Details Patient Name: Eusebio Me Date of Service: 01/19/2020 1:00 PM Medical Record Number: [id number] Patient Account Number: [id number] Date of Birth/Sex: 10/22/1960 (58 y.o. F) Treating RN: Montey Hora Primary Care Hena Ewalt: Clayborn Bigness Other Clinician: Referring Phat Dalton: Leretha Pol Treating Sacha Radloff/Extender: Melburn Hake, HOYT Weeks in Treatment: 0 Primary Learner Assessed: Patient Learning Preferences/Education Level/Primary Language Learning Preference: Explanation, Demonstration Highest Education Level: College or Above Preferred Language: English Cognitive Barrier Language Barrier: No Translator Needed: No Memory Deficit: No Emotional Barrier: No Cultural/Religious Beliefs Affecting Medical Care: No Physical Barrier Impaired Vision: No Impaired Hearing: No Decreased Hand dexterity: No Knowledge/Comprehension Knowledge Level: Medium Comprehension Level: Medium Ability to understand written instructions: Medium Ability to understand verbal instructions: Medium Motivation Anxiety Level: Calm Cooperation: Cooperative Education Importance: Acknowledges Need Interest in Health Problems: Asks Questions Perception: Coherent Willingness to Engage in Self-Management Medium Activities: Readiness to Engage in Self-Management Medium Activities: Electronic Signature(s) Signed: 01/19/2020 4:26:48 PM By: Montey Hora Entered By: Montey Hora on 01/19/2020 13:06:01 Eusebio Me ([id number]) -------------------------------------------------------------------------------- Fall Risk Assessment Details Patient Name: Eusebio Me Date of Service: 01/19/2020 1:00 PM Medical Record Number: [id number] Patient Account Number: [id number] Date of Birth/Sex: 05-Apr-1961 (58 y.o.  F) Treating RN: Montey Hora Primary Care Veleda Mun: Clayborn Bigness Other Clinician: Referring Emillio Ngo: Leretha Pol Treating Zale Marcotte/Extender: Melburn Hake, HOYT Weeks in Treatment: 0 Fall Risk Assessment Items Have you had 2 or more falls in the last 12 monthso 0 No Have you had any fall that resulted in injury in the last 12 monthso 0 No FALLS RISK SCREEN History of falling - immediate or within 3 months 0 No Secondary diagnosis (Do you have 2 or more medical diagnoseso) 0 No Ambulatory aid None/bed rest/wheelchair/nurse 0 Yes Crutches/cane/walker 0 No Furniture 0 No Intravenous therapy Access/Saline/Heparin Lock 0 No Gait/Transferring Normal/ bed rest/ wheelchair 0 Yes Weak (short steps with or without shuffle, stooped but able  to lift head while walking, may 0 No seek support from furniture) Impaired (short steps with shuffle, may have difficulty arising from chair, head down, impaired 0 No balance) Mental Status Oriented to own ability 0 Yes Electronic Signature(s) Signed: 01/19/2020 4:26:48 PM By: Montey Hora Entered By: Montey Hora on 01/19/2020 13:06:11 Eusebio Me ([id number]) -------------------------------------------------------------------------------- Foot Assessment Details Patient Name: Eusebio Me Date of Service: 01/19/2020 1:00 PM Medical Record Number: [id number] Patient Account Number: [id number] Date of Birth/Sex: [birth year] (58 y.o. F) Treating RN: Montey Hora Primary Care [place] Marney: Clayborn Bigness Other Clinician: Referring Eual Lindstrom: Leretha Pol Treating Jamien Casanova/Extender: Melburn Hake, HOYT Weeks in Treatment: 0 Foot Assessment Items Site Locations + = Sensation present, - = Sensation absent, C = Callus, U = Ulcer R = Redness, W = Warmth, M = Maceration, PU = Pre-ulcerative lesion F = Fissure, S = Swelling, D = Dryness Assessment Right: Left: Other Deformity: No No Prior Foot Ulcer: No No Prior Amputation: No No Charcot Joint: No  No Ambulatory Status: Ambulatory Without Help Gait: Steady Electronic Signature(s) Signed: 01/19/2020 4:26:48 PM By: Montey Hora Entered By: Montey Hora on 01/19/2020 13:06:30 Eusebio Me ([id number]) -------------------------------------------------------------------------------- Nutrition Risk Screening Details Patient Name: Eusebio Me Date of Service: 01/19/2020 1:00 PM Medical Record Number: [id number] Patient Account Number: [id number] Date of Birth/Sex: 1961/07/07 (59 y.o. F) Treating RN: Montey Hora Primary Care Tashica Provencio: Clayborn Bigness Other Clinician: Referring Jaylenn Baiza: Leretha Pol Treating Lonza Shimabukuro/Extender: STONE III, HOYT Weeks in Treatment: 0 Height (in): 67 Weight (lbs): 193 Body Mass Index (BMI): 30.2 Nutrition Risk Screening Items Score Screening NUTRITION RISK SCREEN: I have an illness or condition that made me change the kind and/or amount of food I eat 0 No I eat fewer than two meals per day 0 No I eat few fruits and vegetables, or milk products 0 No I have three or more drinks of beer, liquor or wine almost every day 0 No I have tooth or mouth problems that make it hard for me to eat 0 No I don't always have enough money to buy the food I need 0 No I eat alone most of the time 0 No I take three or more different prescribed or over-the-counter drugs a day 1 Yes Without wanting to, I have lost or gained 10 pounds in the last six months 0 No I am not always physically able to shop, cook and/or feed myself 0 No Nutrition Protocols Good Risk Protocol 0 No interventions needed Moderate Risk Protocol High Risk Proctocol Risk Level: Good Risk Score: 1 Electronic Signature(s) Signed: 01/19/2020 4:26:48 PM By: Montey Hora Entered By: Montey Hora on 01/19/2020 13:06:22

## 2020-01-19 NOTE — Progress Notes (Addendum)
DELANO, SCARDINO ([id number]) Visit Report for 01/19/2020 Allergy List Details Patient Name: Shawna Carrillo, Shawna Carrillo Date of Service: 01/19/2020 1:00 PM Medical Record Number: [id number] Patient Account Number: [id number] Date of Birth/Sex: 1961/03/06 (59 y.o. F) Treating RN: Montey Hora Primary Care Calandra Madura: Clayborn Bigness Other Clinician: Referring Merrit Waugh: Leretha Pol Treating Calynn Ferrero/Extender: STONE III, HOYT Weeks in Treatment: 0 Allergies Active Allergies No Known Drug Allergies Allergy Notes Electronic Signature(s) Signed: 01/19/2020 4:26:48 PM By: Montey Hora Entered By: Montey Hora on 01/19/2020 13:01:52 Shawna Carrillo ([id number]) -------------------------------------------------------------------------------- Arrival Information Details Patient Name: Shawna Carrillo Date of Service: 01/19/2020 1:00 PM Medical Record Number: [id number] Patient Account Number: [id number] Date of Birth/Sex: 12/27/1960 (59 y.o. F) Treating RN: Army Melia Primary Care Joanny Dupree: Clayborn Bigness Other Clinician: Referring Ephrem Carrick: Leretha Pol Treating Drishti Pepperman/Extender: Melburn Hake, HOYT Weeks in Treatment: 0 Visit Information Patient Arrived: Ambulatory Arrival Time: 12:51 Accompanied By: self Transfer Assistance: None Patient Identification Verified: Yes Secondary Verification Process Completed: Yes Electronic Signature(s) Signed: 01/19/2020 4:36:30 PM By: Lorine Bears RCP, RRT, CHT Entered By: Lorine Bears on 01/19/2020 12:52:14 Shawna Carrillo ([id number]) -------------------------------------------------------------------------------- Clinic Level of Care Assessment Details Patient Name: Shawna Carrillo Date of Service: 01/19/2020 1:00 PM Medical Record Number: [id number] Patient Account Number: [id number] Date of Birth/Sex: [birth year] (59 y.o. F) Treating RN: Army Melia Primary Care Jadie Allington: Clayborn Bigness Other Clinician: Referring Ilham Roughton:  Leretha Pol Treating Crystale Giannattasio/Extender: Melburn Hake, HOYT Weeks in Treatment: 0 Clinic Level of Care Assessment Items TOOL 1 Quantity Score []  - Use when EandM and Procedure is performed on INITIAL visit 0 ASSESSMENTS - Nursing Assessment / Reassessment X - General Physical Exam (combine w/ comprehensive assessment (listed just below) when performed on new 1 20 pt. evals) X- 1 25 Comprehensive Assessment (HX, ROS, Risk Assessments, Wounds Hx, etc.) ASSESSMENTS - Wound and Skin Assessment / Reassessment []  - Dermatologic / Skin Assessment (not related to wound area) 0 ASSESSMENTS - Ostomy and/or Continence Assessment and Care []  - Incontinence Assessment and Management 0 []  - 0 Ostomy Care Assessment and Management (repouching, etc.) PROCESS - Coordination of Care X - Simple Patient / Family Education for ongoing care 1 15 []  - 0 Complex (extensive) Patient / Family Education for ongoing care X- 1 10 Staff obtains Programmer, systems, Records, Test Results / Process Orders []  - 0 Staff telephones HHA, Nursing Homes / Clarify orders / etc []  - 0 Routine Transfer to another Facility (non-emergent condition) []  - 0 Routine Hospital Admission (non-emergent condition) X- 1 15 New Admissions / Biomedical engineer / Ordering NPWT, Apligraf, etc. []  - 0 Emergency Hospital Admission (emergent condition) PROCESS - Special Needs []  - Pediatric / Minor Patient Management 0 []  - 0 Isolation Patient Management []  - 0 Hearing / Language / Visual special needs []  - 0 Assessment of Community assistance (transportation, D/C planning, etc.) []  - 0 Additional assistance / Altered mentation []  - 0 Support Surface(s) Assessment (bed, cushion, seat, etc.) INTERVENTIONS - Miscellaneous []  - External ear exam 0 []  - 0 Patient Transfer (multiple staff / Civil Service fast streamer / Similar devices) []  - 0 Simple Staple / Suture removal (25 or less) []  - 0 Complex Staple / Suture removal (26 or more) []  -  0 Hypo/Hyperglycemic Management (do not check if billed separately) []  - 0 Ankle / Brachial Index (ABI) - do not check if billed separately Has the patient been seen at the hospital within the last three years: Yes Total Score: 85 Level Of Care: New/Established - Level 3 Gimpel, Yesli ([id number]) Electronic Signature(s)  Signed: 01/19/2020 4:16:16 PM By: Army Melia Entered By: Army Melia on 01/19/2020 13:52:05 Shawna Carrillo ([id number]) -------------------------------------------------------------------------------- Encounter Discharge Information Details Patient Name: Shawna Carrillo Date of Service: 01/19/2020 1:00 PM Medical Record Number: [id number] Patient Account Number: [id number] Date of Birth/Sex: 11/15/1960 (59 y.o. F) Treating RN: Army Melia Primary Care Ruchi Stoney: Clayborn Bigness Other Clinician: Referring Selita Staiger: Leretha Pol Treating Wise Fees/Extender: Melburn Hake, HOYT Weeks in Treatment: 0 Encounter Discharge Information Items Post Procedure Vitals Discharge Condition: Stable Temperature (F): 98.0 Ambulatory Status: Ambulatory Pulse (bpm): 79 Discharge Destination: Home Respiratory Rate (breaths/min): 16 Transportation: Private Auto Blood Pressure (mmHg): 127/89 Accompanied By: self Schedule Follow-up Appointment: Yes Clinical Summary of Care: Electronic Signature(s) Signed: 01/19/2020 4:16:16 PM By: Army Melia Entered By: Army Melia on 01/19/2020 13:52:54 Shawna Carrillo ([id number]) -------------------------------------------------------------------------------- Lower Extremity Assessment Details Patient Name: Shawna Carrillo Date of Service: 01/19/2020 1:00 PM Medical Record Number: [id number] Patient Account Number: [id number] Date of Birth/Sex: 1960/10/06 (59 y.o. F) Treating RN: Montey Hora Primary Care Dex Blakely: Clayborn Bigness Other Clinician: Referring Acheron Sugg: Leretha Pol Treating Kaleth Koy/Extender: STONE III, HOYT Weeks in  Treatment: 0 Edema Assessment Assessed: [Left: No] [Right: No] Edema: [Left: N] [Right: o] Vascular Assessment Pulses: Dorsalis Pedis Palpable: [Right:Yes] Doppler Audible: [Right:Yes] Posterior Tibial Palpable: [Right:Yes] Doppler Audible: [Right:Yes] Blood Pressure: Brachial: [Right:128] Dorsalis Pedis: 152 Ankle: Posterior Tibial: 146 Ankle Brachial Index: [Right:1.19] Electronic Signature(s) Signed: 01/19/2020 4:26:48 PM By: Montey Hora Entered By: Montey Hora on 01/19/2020 13:12:22 Shawna Carrillo ([id number]) -------------------------------------------------------------------------------- Multi Wound Chart Details Patient Name: Shawna Carrillo Date of Service: 01/19/2020 1:00 PM Medical Record Number: [id number] Patient Account Number: [id number] Date of Birth/Sex: 19-Aug-1961 (59 y.o. F) Treating RN: Army Melia Primary Care Shyanna Klingel: Clayborn Bigness Other Clinician: Referring Harvel Meskill: Leretha Pol Treating Ying Blankenhorn/Extender: STONE III, HOYT Weeks in Treatment: 0 Vital Signs Height(in): 67 Pulse(bpm): 79 Weight(lbs): 193 Blood Pressure(mmHg): 127/89 Body Mass Index(BMI): 30 Temperature(F): 98.0 Respiratory Rate(breaths/min): 16 Photos: [N/A:N/A] Wound Location: Right, Plantar Foot N/A N/A Wounding Event: Gradually Appeared N/A N/A Primary Etiology: To be determined N/A N/A Comorbid History: Hypertension, Received Radiation N/A N/A Date Acquired: 11/23/2019 N/A N/A Weeks of Treatment: 0 N/A N/A Wound Status: Open N/A N/A Measurements L x W x D (cm) 0.2x0.2x0.1 N/A N/A Area (cm) : 0.031 N/A N/A Volume (cm) : 0.003 N/A N/A Classification: Full Thickness Without Exposed N/A N/A Support Structures Exudate Amount: Medium N/A N/A Exudate Type: Serous N/A N/A Exudate Color: amber N/A N/A Wound Margin: Flat and Intact N/A N/A Granulation Amount: Large (67-100%) N/A N/A Granulation Quality: Pink N/A N/A Necrotic Amount: Small (1-33%) N/A N/A Exposed  Structures: Fat Layer (Subcutaneous Tissue) N/A N/A Exposed: Yes Fascia: No Tendon: No Muscle: No Joint: No Bone: No Epithelialization: None N/A N/A Treatment Notes Electronic Signature(s) Signed: 01/19/2020 4:16:16 PM By: Army Melia Entered By: Army Melia on 01/19/2020 13:40:21 Shawna Carrillo ([id number]) -------------------------------------------------------------------------------- Multi-Disciplinary Care Plan Details Patient Name: Shawna Carrillo Date of Service: 01/19/2020 1:00 PM Medical Record Number: [id number] Patient Account Number: [id number] Date of Birth/Sex: Jun 28, 1961 (59 y.o. F) Treating RN: Army Melia Primary Care Nuala Chiles: Clayborn Bigness Other Clinician: Referring Nettie Wyffels: Leretha Pol Treating Basha Krygier/Extender: Melburn Hake, HOYT Weeks in Treatment: 0 Active Inactive Orientation to the Wound Care Program Nursing Diagnoses: Knowledge deficit related to the wound healing center program Goals: Patient/caregiver will verbalize understanding of the [facility] Program Date Initiated: 01/19/2020 Target Resolution Date: 02/13/2020 Goal Status: Active Interventions: Provide education on orientation to the wound center Notes: Pressure Nursing Diagnoses: Knowledge deficit related to management of  pressures ulcers Goals: Patient/caregiver will verbalize risk factors for pressure ulcer development Date Initiated: 01/19/2020 Target Resolution Date: 02/13/2020 Goal Status: Active Interventions: Assess potential for pressure ulcer upon admission and as needed Notes: Wound/Skin Impairment Nursing Diagnoses: Impaired tissue integrity Goals: Ulcer/skin breakdown will have a volume reduction of 30% by week 4 Date Initiated: 01/19/2020 Target Resolution Date: 02/13/2020 Goal Status: Active Interventions: Assess ulceration(s) every visit Notes: Electronic Signature(s) Signed: 01/19/2020 4:16:16 PM By: Army Melia Entered By: Army Melia on 01/19/2020  13:40:08 Shawna Carrillo ([id number]) -------------------------------------------------------------------------------- Pain Assessment Details Patient Name: Shawna Carrillo Date of Service: 01/19/2020 1:00 PM Medical Record Number: [id number] Patient Account Number: [id number] Date of Birth/Sex: 11-26-1960 (59 y.o. F) Treating RN: Army Melia Primary Care Evan Osburn: Clayborn Bigness Other Clinician: Referring Andie Mungin: Leretha Pol Treating Kaesen Rodriguez/Extender: Melburn Hake, HOYT Weeks in Treatment: 0 Active Problems Location of Pain Severity and Description of Pain Patient Has Paino No Site Locations Pain Management and Medication Current Pain Management: Electronic Signature(s) Signed: 01/19/2020 4:16:16 PM By: Army Melia Signed: 01/19/2020 4:36:30 PM By: Lorine Bears RCP, RRT, CHT Entered By: Lorine Bears on 01/19/2020 12:53:23 Shawna Carrillo ([id number]) -------------------------------------------------------------------------------- Patient/Caregiver Education Details Patient Name: Shawna Carrillo Date of Service: 01/19/2020 1:00 PM Medical Record Number: [id number] Patient Account Number: [id number] Date of Birth/Gender: 1961-04-17 (59 y.o. F) Treating RN: Army Melia Primary Care Physician: Clayborn Bigness Other Clinician: Referring Physician: Leretha Pol Treating Physician/Extender: Sharalyn Ink in Treatment: 0 Education Assessment Education Provided To: Patient Education Topics Provided Wound/Skin Impairment: Handouts: Caring for Your Ulcer Methods: Demonstration, Explain/Verbal Responses: State content correctly Electronic Signature(s) Signed: 01/19/2020 4:16:16 PM By: Army Melia Entered By: Army Melia on 01/19/2020 13:52:21 Shawna Carrillo ([id number]) -------------------------------------------------------------------------------- Wound Assessment Details Patient Name: Shawna Carrillo Date of Service: 01/19/2020 1:00  PM Medical Record Number: [id number] Patient Account Number: [id number] Date of Birth/Sex: 22-Mar-1961 (59 y.o. F) Treating RN: Montey Hora Primary Care Yannis Gumbs: Clayborn Bigness Other Clinician: Referring Breshae Belcher: Leretha Pol Treating Mardel Grudzien/Extender: STONE III, HOYT Weeks in Treatment: 0 Wound Status Wound Number: 1 Primary Etiology: Abscess Wound Location: Right, Plantar Foot Wound Status: Open Wounding Event: Gradually Appeared Comorbid History: Hypertension, Received Radiation Date Acquired: 11/23/2019 Weeks Of Treatment: 0 Clustered Wound: No Photos Wound Measurements Length: (cm) 0.2 Width: (cm) 0.2 Depth: (cm) 0.1 Area: (cm) 0.031 Volume: (cm) 0.003 % Reduction in Area: 0% % Reduction in Volume: 0% Epithelialization: None Tunneling: No Undermining: No Wound Description Classification: Full Thickness Without Exposed Support Structu Wound Margin: Flat and Intact Exudate Amount: Medium Exudate Type: Serous Exudate Color: amber res Foul Odor After Cleansing: No Slough/Fibrino Yes Wound Bed Granulation Amount: Large (67-100%) Exposed Structure Granulation Quality: Pink Fascia Exposed: No Necrotic Amount: Small (1-33%) Fat Layer (Subcutaneous Tissue) Exposed: Yes Necrotic Quality: Adherent Slough Tendon Exposed: No Muscle Exposed: No Joint Exposed: No Bone Exposed: No Treatment Notes Wound #1 (Right, Plantar Foot) Notes Iodoform packing, BFD Electronic Signature(s) Signed: 01/19/2020 5:24:07 PM By: Irean Hong [place], Joanie ([id number]) Signed: 01/21/2020 4:55:05 PM By: Montey Hora Previous Signature: 01/19/2020 4:26:48 PM Version By: Montey Hora Entered By: Worthy Keeler on 01/19/2020 17:24:06 Shawna Carrillo ([id number]) -------------------------------------------------------------------------------- Vitals Details Patient Name: Shawna Carrillo Date of Service: 01/19/2020 1:00 PM Medical Record Number: [id number] Patient Account  Number: [id number] Date of Birth/Sex: [birth year] (59 y.o. F) Treating RN: Army Melia Primary Care Emauri Krygier: Clayborn Bigness Other Clinician: Referring Cariana Karge: Leretha Pol Treating Gitty Osterlund/Extender: STONE III, HOYT Weeks in Treatment: 0 Vital Signs Time Taken: 12:50 Temperature (F): 98.0 Height (  in): 67 Pulse (bpm): 79 Source: Stated Respiratory Rate (breaths/min): 16 Weight (lbs): 193 Blood Pressure (mmHg): 127/89 Source: Measured Reference Range: 80 - 120 mg / dl Body Mass Index (BMI): 30.2 Electronic Signature(s) Signed: 01/19/2020 4:36:30 PM By: Lorine Bears RCP, RRT, CHT Entered By: Lorine Bears on 01/19/2020 12:53:58

## 2020-01-20 NOTE — Progress Notes (Signed)
CHRISY, HILLEBRAND ([id number]) Visit Report for 01/19/2020 Chief Complaint Document Details Patient Name: Shawna Carrillo, Shawna Carrillo Date of Service: 01/19/2020 1:00 PM Medical Record Number: [id number] Patient Account Number: [id number] Date of Birth/Sex: [birth year] (59 y.o. F) Treating RN: Army Melia Primary Care Provider: Clayborn Bigness Other Clinician: Referring Provider: Leretha Pol Treating Provider/Extender: Melburn Hake, Jareli Highland Weeks in Treatment: 0 Information Obtained from: Patient Chief Complaint Right foot ulcer Electronic Signature(s) Signed: 01/19/2020 1:37:09 PM By: Worthy Keeler PA-C Entered By: Worthy Keeler on 01/19/2020 13:37:08 Eusebio Me ([id number]) -------------------------------------------------------------------------------- Debridement Details Patient Name: Eusebio Me Date of Service: 01/19/2020 1:00 PM Medical Record Number: [id number] Patient Account Number: [id number] Date of Birth/Sex: 16-Jun-1961 (59 y.o. F) Treating RN: Army Melia Primary Care Provider: Clayborn Bigness Other Clinician: Referring Provider: Leretha Pol Treating Provider/Extender: Melburn Hake, Stevi Hollinshead Weeks in Treatment: 0 Debridement Performed for Wound #1 Right,Plantar Foot Assessment: Performed By: Physician STONE III, Kree Rafter E., PA-C Debridement Type: Debridement Level of Consciousness (Pre- Awake and Alert procedure): Pre-procedure Verification/Time Out Yes - 13:39 Taken: Start Time: 13:40 Pain Control: Lidocaine Total Area Debrided (L x W): 0.2 (cm) x 0.2 (cm) = 0.04 (cm) Tissue and other material Viable, Non-Viable, Callus, Subcutaneous debrided: Level: Skin/Subcutaneous Tissue Debridement Description: Excisional Instrument: Curette Bleeding: None End Time: 13:41 Response to Treatment: Procedure was tolerated well Level of Consciousness (Post- Awake and Alert procedure): Post Debridement Measurements of Total Wound Length: (cm) 0.2 Width: (cm) 0.2 Depth: (cm)  0.1 Volume: (cm) 0.003 Character of Wound/Ulcer Post Debridement: Stable Post Procedure Diagnosis Same as Pre-procedure Electronic Signature(s) Signed: 01/19/2020 4:16:16 PM By: Army Melia Signed: 01/20/2020 11:00:46 AM By: Worthy Keeler PA-C Entered By: Army Melia on 01/19/2020 13:41:05 Eusebio Me ([id number]) -------------------------------------------------------------------------------- HPI Details Patient Name: Eusebio Me Date of Service: 01/19/2020 1:00 PM Medical Record Number: [id number] Patient Account Number: [id number] Date of Birth/Sex: December 19, 1960 (59 y.o. F) Treating RN: Army Melia Primary Care Provider: Clayborn Bigness Other Clinician: Referring Provider: Leretha Pol Treating Provider/Extender: Melburn Hake, Annis Lagoy Weeks in Treatment: 0 History of Present Illness HPI Description: 01/19/2020 upon evaluation today patient presents for initial evaluation here in our clinic concerning a wound that she has had on her right plantar foot. She tells me that this started really seemingly unprovoked she had a callus in the area which she subsequently noted began to drain and appeared to be infected. Subsequently the patient has been on Keflex she still has about 6 days of this left. Nonetheless she is having some issues still with drainage although it is improved dramatically with the antibiotic she tells me. This appears to have been an abscess. Upon further probing I did note that the patient had some undermining in regard to the wound which does have me concerned again about possibly needing to lightly packed this area. That is something that I discussed with her today as well. She has no major medical problems. She is not diabetic the only issue that she is dealing with right now is lung cancer which obviously is significant but again nothing related to her wounds specifically. Electronic Signature(s) Signed: 01/19/2020 5:24:35 PM By: Worthy Keeler PA-C Entered By:  Worthy Keeler on 01/19/2020 17:24:34 EILIS, CHESTNUTT ([id number]) -------------------------------------------------------------------------------- Physical Exam Details Patient Name: Eusebio Me Date of Service: 01/19/2020 1:00 PM Medical Record Number: [id number] Patient Account Number: [id number] Date of Birth/Sex: 1961/03/07 (59 y.o. F) Treating RN: Army Melia Primary Care Provider: Clayborn Bigness Other Clinician: Referring Provider: Leretha Pol Treating Provider/Extender: STONE III, Christen Wardrop Weeks in Treatment: 0  Constitutional sitting or standing blood pressure is within target range for patient.. pulse regular and within target range for patient.Marland Kitchen respirations regular, non- labored and within target range for patient.Marland Kitchen temperature within target range for patient.. Well-nourished and well-hydrated in no acute distress. Eyes conjunctiva clear no eyelid edema noted. pupils equal round and reactive to light and accommodation. Ears, Nose, Mouth, and Throat no gross abnormality of ear auricles or external auditory canals. normal hearing noted during conversation. mucus membranes moist. Respiratory normal breathing without difficulty. Cardiovascular no clubbing, cyanosis, significant edema, <3 sec cap refill. Musculoskeletal normal gait and posture. no significant deformity or arthritic changes, no loss or range of motion, no clubbing. Psychiatric this patient is able to make decisions and demonstrates good insight into disease process. Alert and Oriented x 3. pleasant and cooperative. Notes Upon inspection patient's wound bed actually showed signs of good granulation and epithelization. The central area of opening however when I probed did have undermining which was somewhat significant. I do believe that we need to likely packed this area in order to help this to heal appropriately. The patient understands and states that this is something she may be able to do but if she cannot she  has someone who can do this for her at home. That is great news. Electronic Signature(s) Signed: 01/19/2020 5:26:17 PM By: Worthy Keeler PA-C Entered By: Worthy Keeler on 01/19/2020 17:26:16 Eusebio Me ([id number]) -------------------------------------------------------------------------------- Physician Orders Details Patient Name: Eusebio Me Date of Service: 01/19/2020 1:00 PM Medical Record Number: [id number] Patient Account Number: [id number] Date of Birth/Sex: [birth year] (58 y.o. F) Treating RN: Army Melia Primary Care Provider: Clayborn Bigness Other Clinician: Referring Provider: Leretha Pol Treating Provider/Extender: Melburn Hake, Naksh Radi Weeks in Treatment: 0 Verbal / Phone Orders: No Diagnosis Coding ICD-10 Coding Code Description L02.611 Cutaneous abscess of right foot L97.512 Non-pressure chronic ulcer of other part of right foot with fat layer exposed I10 Essential (primary) hypertension C34.90 Malignant neoplasm of unspecified part of unspecified bronchus or lung Wound Cleansing Wound #1 Right,Plantar Foot o Clean wound with Normal Saline. Primary Wound Dressing Wound #1 Right,Plantar Foot o Iodoform packing Gauze - 1/4 inch, cut in half lightly packed into wound at 10 oclock Secondary Dressing Wound #1 Right,Plantar Foot o Boardered Foam Dressing Dressing Change Frequency Wound #1 Right,Plantar Foot o Change dressing every other day. Follow-up Appointments Wound #1 Right,Plantar Foot o Return Appointment in 1 week. Electronic Signature(s) Signed: 01/19/2020 4:16:16 PM By: Army Melia Signed: 01/20/2020 11:00:46 AM By: Worthy Keeler PA-C Entered By: Army Melia on 01/19/2020 13:51:43 Eusebio Me ([id number]) -------------------------------------------------------------------------------- Problem List Details Patient Name: Eusebio Me Date of Service: 01/19/2020 1:00 PM Medical Record Number: [id number] Patient Account Number:  [id number] Date of Birth/Sex: Feb 14, 1961 (58 y.o. F) Treating RN: Army Melia Primary Care Provider: Clayborn Bigness Other Clinician: Referring Provider: Leretha Pol Treating Provider/Extender: Melburn Hake, Jeanie Mccard Weeks in Treatment: 0 Active Problems ICD-10 Encounter Code Description Active Date MDM Diagnosis L02.611 Cutaneous abscess of right foot 01/19/2020 No Yes L97.512 Non-pressure chronic ulcer of other part of right foot with fat layer 01/19/2020 No Yes exposed [place] (primary) hypertension 01/19/2020 No Yes C34.90 Malignant neoplasm of unspecified part of unspecified bronchus or lung 01/19/2020 No Yes Inactive Problems Resolved Problems Electronic Signature(s) Signed: 01/19/2020 1:36:48 PM By: Worthy Keeler PA-C Entered By: Worthy Keeler on 01/19/2020 13:36:47 Eusebio Me ([id number]) -------------------------------------------------------------------------------- Progress Note Details Patient Name: Eusebio Me Date of Service: 01/19/2020 1:00 PM Medical Record Number: [id number] Patient Account  Number: [id number] Date of Birth/Sex: 07-22-1961 (59 y.o. F) Treating RN: Army Melia Primary Care Provider: Clayborn Bigness Other Clinician: Referring Provider: Leretha Pol Treating Provider/Extender: Melburn Hake, Jinna Weinman Weeks in Treatment: 0 Subjective Chief Complaint Information obtained from Patient Right foot ulcer History of Present Illness (HPI) 01/19/2020 upon evaluation today patient presents for initial evaluation here in our clinic concerning a wound that she has had on her right plantar foot. She tells me that this started really seemingly unprovoked she had a callus in the area which she subsequently noted began to drain and appeared to be infected. Subsequently the patient has been on Keflex she still has about 6 days of this left. Nonetheless she is having some issues still with drainage although it is improved dramatically with the antibiotic she tells me.  This appears to have been an abscess. Upon further probing I did note that the patient had some undermining in regard to the wound which does have me concerned again about possibly needing to lightly packed this area. That is something that I discussed with her today as well. She has no major medical problems. She is not diabetic the only issue that she is dealing with right now is lung cancer which obviously is significant but again nothing related to her wounds specifically. Patient History Information obtained from Patient. Allergies No Known Drug Allergies Family History Diabetes - Maternal Grandparents, No family history of Cancer, Heart Disease, Hereditary Spherocytosis, Hypertension, Kidney Disease, Lung Disease, Seizures, Stroke, Thyroid Problems, Tuberculosis. Social History Former smoker - 3 months, Marital Status - Single, Alcohol Use - Never, Drug Use - No History, Caffeine Use - Daily. Medical History Cardiovascular Patient has history of Hypertension Denies history of Angina, Arrhythmia, Congestive Heart Failure, Coronary Artery Disease, Deep Vein Thrombosis, Hypotension, Myocardial Infarction, Peripheral Arterial Disease, Peripheral Venous Disease, Phlebitis, Vasculitis Integumentary (Skin) Denies history of History of Burn, History of pressure wounds Neurologic Denies history of Dementia, Neuropathy, Quadriplegia, Paraplegia, Seizure Disorder Oncologic Patient has history of Received Radiation Denies history of Received Chemotherapy Medical And Surgical History Notes Neurologic migraine Oncologic lung cancer - immunotherapy Review of Systems (ROS) Constitutional Symptoms (General Health) Denies complaints or symptoms of Fatigue, Fever, Chills, Marked Weight Change. Eyes Denies complaints or symptoms of Dry Eyes, Vision Changes, Glasses / Contacts. Ear/Nose/Mouth/Throat Denies complaints or symptoms of Difficult clearing ears,  Sinusitis. Hematologic/Lymphatic Denies complaints or symptoms of Bleeding / Clotting Disorders, Human Immunodeficiency Virus. Respiratory Denies complaints or symptoms of Chronic or frequent coughs, Shortness of Breath. Cardiovascular Denies complaints or symptoms of Chest pain, LE edema. Gastrointestinal Denies complaints or symptoms of Frequent diarrhea, Nausea, Vomiting. Endocrine Denies complaints or symptoms of Hepatitis, Thyroid disease, Polydypsia (Excessive Thirst). KAIDANCE, PANTOJA ([id number]) Genitourinary Denies complaints or symptoms of Kidney failure/ Dialysis, Incontinence/dribbling. Immunological Denies complaints or symptoms of Hives, Itching. Integumentary (Skin) Complains or has symptoms of Wounds. Denies complaints or symptoms of Bleeding or bruising tendency, Breakdown, Swelling. Musculoskeletal Denies complaints or symptoms of Muscle Pain, Muscle Weakness. Neurologic Denies complaints or symptoms of Numbness/parasthesias, Focal/Weakness. Psychiatric Denies complaints or symptoms of Anxiety, Claustrophobia. Objective Constitutional sitting or standing blood pressure is within target range for patient.. pulse regular and within target range for patient.Marland Kitchen respirations regular, non- labored and within target range for patient.Marland Kitchen temperature within target range for patient.. Well-nourished and well-hydrated in no acute distress. Vitals Time Taken: 12:50 PM, Height: 67 in, Source: Stated, Weight: 193 lbs, Source: Measured, BMI: 30.2, Temperature: 98.0 F, Pulse: 79 bpm, Respiratory Rate: 16 breaths/min, Blood  Pressure: 127/89 mmHg. Eyes conjunctiva clear no eyelid edema noted. pupils equal round and reactive to light and accommodation. Ears, Nose, Mouth, and Throat no gross abnormality of ear auricles or external auditory canals. normal hearing noted during conversation. mucus membranes moist. Respiratory normal breathing without difficulty. Cardiovascular no  clubbing, cyanosis, significant edema, Musculoskeletal normal gait and posture. no significant deformity or arthritic changes, no loss or range of motion, no clubbing. Psychiatric this patient is able to make decisions and demonstrates good insight into disease process. Alert and Oriented x 3. pleasant and cooperative. General Notes: Upon inspection patient's wound bed actually showed signs of good granulation and epithelization. The central area of opening however when I probed did have undermining which was somewhat significant. I do believe that we need to likely packed this area in order to help this to heal appropriately. The patient understands and states that this is something she may be able to do but if she cannot she has someone who can do this for her at home. That is great news. Integumentary (Hair, Skin) Wound #1 status is Open. Original cause of wound was Gradually Appeared. The wound is located on the [place]. The wound measures 0.2cm length x 0.2cm width x 0.1cm depth; 0.031cm^2 area and 0.003cm^3 volume. There is Fat Layer (Subcutaneous Tissue) Exposed exposed. There is no tunneling or undermining noted. There is a medium amount of serous drainage noted. The wound margin is flat and intact. There is large (67-100%) pink granulation within the wound bed. There is a small (1-33%) amount of necrotic tissue within the wound bed including Adherent Slough. Assessment Active Problems ICD-10 Cutaneous abscess of right foot Non-pressure chronic ulcer of other part of right foot with fat layer exposed Essential (primary) hypertension Malignant neoplasm of unspecified part of unspecified bronchus or lung Ungerer, Sianna ([id number]) Procedures Wound #1 Pre-procedure diagnosis of Wound #1 is a To be determined located on the [place] . There was a Excisional Skin/Subcutaneous Tissue Debridement with a total area of 0.04 sq cm performed by STONE III, Takira Sherrin E., PA-C.  With the following instrument(s): Curette to remove Viable and Non-Viable tissue/material. Material removed includes Callus and Subcutaneous Tissue and after achieving pain control using Lidocaine. A time out was conducted at 13:39, prior to the start of the procedure. There was no bleeding. The procedure was tolerated well. Post Debridement Measurements: 0.2cm length x 0.2cm width x 0.1cm depth; 0.003cm^3 volume. Character of Wound/Ulcer Post Debridement is stable. Post procedure Diagnosis Wound #1: Same as Pre-Procedure Plan Wound Cleansing: Wound #1 Right,Plantar Foot: Clean wound with Normal Saline. Primary Wound Dressing: Wound #1 Right,Plantar Foot: Iodoform packing Gauze - 1/4 inch, cut in half lightly packed into wound at 10 oclock Secondary Dressing: Wound #1 Right,Plantar Foot: Boardered Foam Dressing Dressing Change Frequency: Wound #1 Right,Plantar Foot: Change dressing every other day. Follow-up Appointments: Wound #1 Right,Plantar Foot: Return Appointment in 1 week. 1, recommend currently that we go ahead and initiate treatment with a iodoform gauze packing into the central opening at this point this needs to be mild it does not have to be overly packed and I discussed this all with the patient today. 2. Also can recommend that she continue to monitor for any signs of infection. She continues on the Keflex at this point which I think is good. 3. I am also can recommend that she continue to evaluate for any signs of systemic infection that was discussed today again I see no evidence of anything going on  right now at this point. We will see patient back for reevaluation in 1 week here in the clinic. If anything worsens or changes patient will contact our office for additional recommendations. Electronic Signature(s) Signed: 01/19/2020 5:27:12 PM By: Worthy Keeler PA-C Entered By: Worthy Keeler on 01/19/2020 17:27:11 Eusebio Me  ([id number]) -------------------------------------------------------------------------------- ROS/PFSH Details Patient Name: Eusebio Me Date of Service: 01/19/2020 1:00 PM Medical Record Number: [id number] Patient Account Number: [id number] Date of Birth/Sex: [birth year] (58 y.o. F) Treating RN: Montey Hora Primary Care Provider: Clayborn Bigness Other Clinician: Referring Provider: Leretha Pol Treating Provider/Extender: Melburn Hake, Xabi Wittler Weeks in Treatment: 0 Information Obtained From Patient Constitutional Symptoms (General Health) Complaints and Symptoms: Negative for: Fatigue; Fever; Chills; Marked Weight Change Eyes Complaints and Symptoms: Negative for: Dry Eyes; Vision Changes; Glasses / Contacts Ear/Nose/Mouth/Throat Complaints and Symptoms: Negative for: Difficult clearing ears; Sinusitis Hematologic/Lymphatic Complaints and Symptoms: Negative for: Bleeding / Clotting Disorders; Human Immunodeficiency Virus Respiratory Complaints and Symptoms: Negative for: Chronic or frequent coughs; Shortness of Breath Cardiovascular Complaints and Symptoms: Negative for: Chest pain; LE edema Medical History: Positive for: Hypertension Negative for: Angina; Arrhythmia; Congestive Heart Failure; Coronary Artery Disease; Deep Vein Thrombosis; Hypotension; Myocardial Infarction; Peripheral Arterial Disease; Peripheral Venous Disease; Phlebitis; Vasculitis Gastrointestinal Complaints and Symptoms: Negative for: Frequent diarrhea; Nausea; Vomiting Endocrine Complaints and Symptoms: Negative for: Hepatitis; Thyroid disease; Polydypsia (Excessive Thirst) Genitourinary Complaints and Symptoms: Negative for: Kidney failure/ Dialysis; Incontinence/dribbling Immunological Complaints and Symptoms: Negative for: Hives; Itching RHAYNE, CHATWIN ([id number]) Integumentary (Skin) Complaints and Symptoms: Positive for: Wounds Negative for: Bleeding or bruising tendency; Breakdown;  Swelling Medical History: Negative for: History of Burn; History of pressure wounds Musculoskeletal Complaints and Symptoms: Negative for: Muscle Pain; Muscle Weakness Neurologic Complaints and Symptoms: Negative for: Numbness/parasthesias; Focal/Weakness Medical History: Negative for: Dementia; Neuropathy; Quadriplegia; Paraplegia; Seizure Disorder Past Medical History Notes: migraine Psychiatric Complaints and Symptoms: Negative for: Anxiety; Claustrophobia Oncologic Medical History: Positive for: Received Radiation Negative for: Received Chemotherapy Past Medical History Notes: lung cancer - immunotherapy Immunizations Pneumococcal Vaccine: Received Pneumococcal Vaccination: No Implantable Devices None Family and Social History Cancer: No; Diabetes: Yes - Maternal Grandparents; Heart Disease: No; Hereditary Spherocytosis: No; Hypertension: No; Kidney Disease: No; Lung Disease: No; Seizures: No; Stroke: No; Thyroid Problems: No; Tuberculosis: No; Former smoker - 3 months; Marital Status - Single; Alcohol Use: Never; Drug Use: No History; Caffeine Use: Daily; Financial Concerns: No; Food, Clothing or Shelter Needs: No; Support System Lacking: No; Transportation Concerns: No Electronic Signature(s) Signed: 01/19/2020 4:26:48 PM By: Montey Hora Signed: 01/20/2020 11:00:46 AM By: Worthy Keeler PA-C Entered By: Montey Hora on 01/19/2020 13:05:09 Eusebio Me ([id number]) -------------------------------------------------------------------------------- SuperBill Details Patient Name: Eusebio Me Date of Service: 01/19/2020 Medical Record Number: [id number] Patient Account Number: [id number] Date of Birth/Sex: 05/24/1961 (59 y.o. F) Treating RN: Army Melia Primary Care Provider: Clayborn Bigness Other Clinician: Referring Provider: Leretha Pol Treating Provider/Extender: Melburn Hake, Glynnis Gavel Weeks in Treatment: 0 Diagnosis Coding ICD-10 Codes Code  Description L02.611 Cutaneous abscess of right foot L97.512 Non-pressure chronic ulcer of other part of right foot with fat layer exposed I10 Essential (primary) hypertension C34.90 Malignant neoplasm of unspecified part of unspecified bronchus or lung Facility Procedures CPT4 Code: 10258527 Description: 99213 - WOUND CARE VISIT-LEV 3 EST PT Modifier: Quantity: 1 CPT4 Code: 78242353 Description: 11042 - DEB SUBQ TISSUE 20 SQ CM/< Modifier: Quantity: 1 CPT4 Code: Description: ICD-10 Diagnosis Description L97.512 Non-pressure chronic ulcer of other part of right foot with fat layer ex Modifier: posed Quantity: Physician  Procedures CPT4 Code: 7897847 Description: 84128 - WC PHYS LEVEL 4 - NEW PT Modifier: 25 Quantity: 1 CPT4 Code: Description: ICD-10 Diagnosis Description L02.611 Cutaneous abscess of right foot L97.512 Non-pressure chronic ulcer of other part of right foot with fat layer ex I10 Essential (primary) hypertension C34.90 Malignant neoplasm of unspecified part of  unspecified bronchus or lung Modifier: posed Quantity: CPT4 Code: [id number] Description: 71959 - WC PHYS SUBQ TISS 20 SQ CM Modifier: Quantity: 1 CPT4 Code: Description: ICD-10 Diagnosis Description D47.185 Non-pressure chronic ulcer of other part of right foot with fat layer ex Modifier: posed Quantity: Electronic Signature(s) Signed: 01/19/2020 5:27:44 PM By: Worthy Keeler PA-C Entered By: Worthy Keeler on 01/19/2020 17:27:44

## 2020-01-25 DIAGNOSIS — R059 Cough, unspecified: Secondary | ICD-10-CM | POA: Insufficient documentation

## 2020-01-25 DIAGNOSIS — L02611 Cutaneous abscess of right foot: Secondary | ICD-10-CM | POA: Insufficient documentation

## 2020-01-26 ENCOUNTER — Ambulatory Visit: Payer: Self-pay | Admitting: Physician Assistant

## 2020-01-27 ENCOUNTER — Encounter: Payer: Self-pay | Admitting: Nurse Practitioner

## 2020-01-27 ENCOUNTER — Other Ambulatory Visit: Payer: Self-pay | Admitting: Nurse Practitioner

## 2020-01-27 DIAGNOSIS — L02611 Cutaneous abscess of right foot: Secondary | ICD-10-CM

## 2020-01-27 MED ORDER — CEPHALEXIN 500 MG PO CAPS: 500.0000 mg | ORAL_CAPSULE | Freq: Three times a day (TID) | ORAL | 0 refills | Status: DC

## 2020-01-27 NOTE — Progress Notes (Signed)
Repeat keflex three times daily for next 10 days. Discuss with wound care at appointment on Thursday.

## 2020-01-29 ENCOUNTER — Ambulatory Visit: Payer: Self-pay | Admitting: Internal Medicine

## 2020-03-09 ENCOUNTER — Other Ambulatory Visit: Payer: Self-pay | Admitting: Pain Medicine

## 2020-03-09 DIAGNOSIS — G894 Chronic pain syndrome: Secondary | ICD-10-CM

## 2020-03-22 ENCOUNTER — Telehealth: Payer: Self-pay | Admitting: Pain Medicine

## 2020-03-22 NOTE — Progress Notes (Signed)
Unsuccessful attempt to contact patient for Virtual Visit (Pain Management Telehealth)   Patient provided contact information:  [phone] (home); [phone] (mobile); (Preferred) [phone] [email or web address]   Pre-screening:  Our staff was successful in contacting Ms. Lichty using the above provided information.   I unsuccessfully attempted to make contact with Eusebio Me twice on 03/23/2020 via telephone. I was unable to complete the virtual encounter due to call going directly to voicemail. I was able to leave a message where I clearly identify myself as Gaspar Cola, MD and I left a message to call us back to reschedule the call.  Pharmacotherapy Assessment  Analgesic: Tramadol 100 mg every 6 hours (400 mg/day of tramadol) MME/day:40 mg/day.   Follow-up plan:   Reschedule Visit.     Considering: Diagnostic bilateral lumbar facet block  Diagnostic caudal epidural steroid injection + epidurogram for the lower extremity pain   Palliative PRN treatment(s): Palliativebilateral lumbar facet block  Palliativecaudal epidural steroid injection + epidurogram for the lower extremity pain.    Recent Visits No visits were found meeting these conditions. Showing recent visits within past 90 days and meeting all other requirements Today's Visits Date Type Provider Dept  03/23/20 Telemedicine Milinda Pointer, MD [facility]  Showing today's visits and meeting all other requirements Future Appointments No visits were found meeting these conditions. Showing future appointments within next 90 days and meeting all other requirements   Note by: Gaspar Cola, MD Date: 03/23/2020; Time: 4:00 PM

## 2020-03-23 ENCOUNTER — Other Ambulatory Visit: Payer: Self-pay

## 2020-03-23 ENCOUNTER — Ambulatory Visit: Payer: Worker's Compensation | Attending: Pain Medicine | Admitting: Pain Medicine

## 2020-03-23 DIAGNOSIS — G894 Chronic pain syndrome: Secondary | ICD-10-CM | POA: Insufficient documentation

## 2020-03-25 ENCOUNTER — Telehealth: Payer: Self-pay | Admitting: Pain Medicine

## 2020-03-25 NOTE — Telephone Encounter (Signed)
Patient called to say she will no longer be coming to Dr. Dossie Arbour. She will be going to different physician.

## 2020-05-05 ENCOUNTER — Other Ambulatory Visit: Payer: Self-pay | Admitting: Nurse Practitioner

## 2020-05-05 ENCOUNTER — Encounter: Payer: Self-pay | Admitting: Nurse Practitioner

## 2020-05-05 DIAGNOSIS — I10 Essential (primary) hypertension: Secondary | ICD-10-CM

## 2020-05-05 MED ORDER — ATENOLOL 25 MG PO TABS: 25.0000 mg | ORAL_TABLET | Freq: Two times a day (BID) | ORAL | 1 refills | Status: DC

## 2020-05-11 ENCOUNTER — Telehealth: Payer: Self-pay

## 2020-05-11 NOTE — Telephone Encounter (Signed)
Confirmed and screened for 05-12-20 ov. 

## 2020-05-12 ENCOUNTER — Encounter: Payer: Self-pay | Admitting: Hospice and Palliative Medicine

## 2020-05-12 ENCOUNTER — Ambulatory Visit: Payer: Medicare Other | Admitting: Hospice and Palliative Medicine

## 2020-05-12 ENCOUNTER — Other Ambulatory Visit: Payer: Self-pay

## 2020-05-12 DIAGNOSIS — N3001 Acute cystitis with hematuria: Secondary | ICD-10-CM | POA: Diagnosis not present

## 2020-05-12 DIAGNOSIS — R300 Dysuria: Secondary | ICD-10-CM | POA: Diagnosis not present

## 2020-05-12 DIAGNOSIS — C3401 Malignant neoplasm of right main bronchus: Secondary | ICD-10-CM

## 2020-05-12 MED ORDER — NITROFURANTOIN MONOHYD MACRO 100 MG PO CAPS: 100.0000 mg | ORAL_CAPSULE | Freq: Two times a day (BID) | ORAL | 0 refills | Status: DC

## 2020-05-12 NOTE — Progress Notes (Signed)
[facility] [place], [place] [address]  Internal MEDICINE  Office Visit Note   Patient Name: Shawna Carrillo  [id number]  [id number]  Date of Service: 05/13/2020  Chief Complaint  Patient presents with  . Acute Visit  . Urinary Tract Infection    HPI Patient is being seen today for a sick visit. Complains of flank pain, increased urine frequency as well as burning with urination. 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 She says this is her second urinary tract infection since starting immunotherapy.  Current Medication: Outpatient Encounter Medications as of 05/12/2020  Medication Sig Note  . albuterol (VENTOLIN HFA) 108 (90 Base) MCG/ACT inhaler Inhale 2 puffs into the lungs every 6 (six) hours as needed for wheezing or shortness of breath.   Marland Kitchen amLODipine (NORVASC) 10 MG tablet Take 1 tablet (10 mg total) by mouth daily.   Marland Kitchen atenolol (TENORMIN) 25 MG tablet Take 1 tablet (25 mg total) by mouth 2 (two) times daily.   . benzonatate (TESSALON) 100 MG capsule Take by mouth 3 (three) times daily as needed for cough.   . calcium carbonate (CALCIUM 600) 600 MG TABS tablet Take 1 tablet (600 mg total) by mouth 2 (two) times daily with a meal.   . cephALEXin (KEFLEX) 500 MG capsule Take 1 capsule (500 mg total) by mouth 3 (three) times daily.   . Cholecalciferol (VITAMIN D3) 125 MCG (5000 UT) CAPS Take 1 capsule (5,000 Units total) by mouth daily with breakfast. Take along with calcium and magnesium.   . diazepam (VALIUM) 5 MG tablet TAKE 1 TABLET BY MOUTH AT BEDTIME AS NEEDED FOR ANXIETY OR MUSCLE SPASMS   . erythromycin ophthalmic ointment Place 1 application into the left eye 4 (four) times daily.   . Fluticasone-Salmeterol (ADVAIR DISKUS) 250-50 MCG/DOSE AEPB Inhale 1 puff into the lungs 2 (two) times daily.   Marland Kitchen guaiFENesin-codeine 100-10 MG/5ML syrup Take 5 mLs by mouth at bedtime as needed for cough.   Marland Kitchen  ibuprofen (ADVIL,MOTRIN) 200 MG tablet Take 800 mg by mouth as needed for mild pain.   . DULoxetine (CYMBALTA) 60 MG capsule Take 1 capsule (60 mg total) by mouth daily.   . nitrofurantoin, macrocrystal-monohydrate, (MACROBID) 100 MG capsule Take 1 capsule (100 mg total) by mouth 2 (two) times daily.   . traMADol (ULTRAM) 50 MG tablet Take 2 tablets (100 mg total) by mouth every 6 (six) hours as needed. 09/08/2019: Future Prescription, NOT DUPLICATES. >>>DO NOT DELETE, even if Expired!!!<<< See Care Coordination Note from [facility] (Dr. Dossie Arbour)     No facility-administered encounter medications on file as of 05/12/2020.    Surgical History: Past Surgical History:  Procedure Laterality Date  . hilar myectomy    . LUMBAR FUSION  2012   FAILED    Medical History: Past Medical History:  Diagnosis Date  . B12 deficiency   . History of anemia 07/12/2015  . History of migraine 07/12/2015  . History of peptic ulcer disease 07/12/2015  . Hypertension   . Insomnia     Family History: Family History  Problem Relation Age of Onset  . Diabetes Mother   . Heart disease Father     Social History   Socioeconomic History  . Marital status: Married    Spouse name: Not on file  . Number of children: Not on file  . Years of education: Not on file  . Highest education level: Not on  file  Occupational History  . Not on file  Tobacco Use  . Smoking status: Former Smoker    Packs/day: 1.00    Types: Cigarettes  . Smokeless tobacco: Never Used  . Tobacco comment: DOES NOT SMOKE ANYMORE REPORTED ON 10/22/19  Vaping Use  . Vaping Use: Never used  Substance and Sexual Activity  . Alcohol use: No    Alcohol/week: 0.0 standard drinks  . Drug use: No  . Sexual activity: Not on file  Other Topics Concern  . Not on file  Social History Narrative  . Not on file   Social Determinants of Health   Financial Resource Strain:   . Difficulty of Paying Living Expenses: Not on file   Food Insecurity:   . Worried About Charity fundraiser in the Last Year: Not on file  . Ran Out of Food in the Last Year: Not on file  Transportation Needs:   . Lack of Transportation (Medical): Not on file  . Lack of Transportation (Non-Medical): Not on file  Physical Activity:   . Days of Exercise per Week: Not on file  . Minutes of Exercise per Session: Not on file  Stress:   . Feeling of Stress : Not on file  Social Connections:   . Frequency of Communication with Friends and Family: Not on file  . Frequency of Social Gatherings with Friends and Family: Not on file  . Attends Religious Services: Not on file  . Active Member of Clubs or Organizations: Not on file  . Attends Archivist Meetings: Not on file  . Marital Status: Not on file  Intimate Partner Violence:   . Fear of Current or Ex-Partner: Not on file  . Emotionally Abused: Not on file  . Physically Abused: Not on file  . Sexually Abused: Not on file   Review of Systems  Constitutional: Negative for chills, diaphoresis, fatigue and fever.  HENT: Negative for ear pain, postnasal drip and sinus pressure.   Eyes: Negative for photophobia, discharge, redness, itching and visual disturbance.  Respiratory: Negative for cough, shortness of breath and wheezing.   Cardiovascular: Negative for chest pain, palpitations and leg swelling.  Gastrointestinal: Negative for abdominal pain, constipation, diarrhea, nausea and vomiting.  Genitourinary: Positive for dysuria, flank pain, frequency and urgency.  Musculoskeletal: Negative for arthralgias, back pain, gait problem and neck pain.  Skin: Negative for color change.  Allergic/Immunologic: Negative for environmental allergies and food allergies.  Neurological: Negative for dizziness and headaches.  Hematological: Does not bruise/bleed easily.  Psychiatric/Behavioral: Negative for agitation, behavioral problems (depression) and hallucinations.    Vital Signs: BP  137/85   Pulse 78   Temp (!) 97.4 F (36.3 C)   Resp 16   Ht 5\' 7"  (1.702 m)   Wt 190 lb 3.2 oz (86.3 kg)   SpO2 94%   BMI 29.79 kg/m    Physical Exam Vitals reviewed.  Constitutional:      Appearance: Normal appearance.  HENT:     Mouth/Throat:     Mouth: Mucous membranes are moist.     Pharynx: Oropharynx is clear.  Cardiovascular:     Rate and Rhythm: Normal rate and regular rhythm.     Pulses: Normal pulses.     Heart sounds: Normal heart sounds.  Pulmonary:     Effort: Pulmonary effort is normal.     Breath sounds: Normal breath sounds.  Abdominal:     General: Abdomen is flat.     Palpations: Abdomen  is soft.  Musculoskeletal:        General: Normal range of motion.     Cervical back: Normal range of motion.  Skin:    General: Skin is warm.  Neurological:     General: No focal deficit present.     Mental Status: She is alert and oriented to person, place, and time. Mental status is at baseline.  Psychiatric:        Mood and Affect: Mood normal.        Behavior: Behavior normal.        Thought Content: Thought content normal.     Assessment/Plan: 1. Acute cystitis with hematuria Will treat with Macrobid. If this continues to be a recurrent issue may need to consider prophylactic therapy while undergoing immunotherapy. Will need to consult with her hematologist. - nitrofurantoin, macrocrystal-monohydrate, (MACROBID) 100 MG capsule; Take 1 capsule (100 mg total) by mouth 2 (two) times daily.  Dispense: 14 capsule; Refill: 0  2. 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  3. Dysuria - UA/M w/rflx Culture, Routine - Microscopic Examination - Urine Culture, Reflex  General Counseling: Mallery verbalizes understanding of the findings of todays visit and agrees with plan of treatment. I have discussed any further diagnostic evaluation that may be needed or ordered today.  We also reviewed her medications today. she has been encouraged to call the office with any questions or concerns that should arise related to todays visit.    Orders Placed This Encounter  Procedures  . Microscopic Examination  . Urine Culture, Reflex  . UA/M w/rflx Culture, Routine    Meds ordered this encounter  Medications  . nitrofurantoin, macrocrystal-monohydrate, (MACROBID) 100 MG capsule    Sig: Take 1 capsule (100 mg total) by mouth 2 (two) times daily.    Dispense:  14 capsule    Refill:  0    Time spent: 25 Minutes   This patient was seen by Theodoro Grist AGNP-C in Collaboration with Dr Lavera Guise as a part of collaborative care agreement     Tanna Furry. Jaicee Michelotti AGNP-C Internal medicine

## 2020-05-13 ENCOUNTER — Encounter: Payer: Self-pay | Admitting: Hospice and Palliative Medicine

## 2020-05-15 LAB — MICROSCOPIC EXAMINATION
Casts: NONE SEEN /lpf
Epithelial Cells (non renal): NONE SEEN /hpf (ref 0–10)

## 2020-05-15 LAB — URINE CULTURE, REFLEX

## 2020-05-15 LAB — UA/M W/RFLX CULTURE, ROUTINE
Bilirubin, UA: NEGATIVE
Glucose, UA: NEGATIVE
Ketones, UA: NEGATIVE
Nitrite, UA: POSITIVE — AB
Protein,UA: NEGATIVE
Specific Gravity, UA: 1.009 (ref 1.005–1.030)
Urobilinogen, Ur: 1 mg/dL (ref 0.2–1.0)
pH, UA: 6.5 (ref 5.0–7.5)

## 2020-05-21 ENCOUNTER — Other Ambulatory Visit: Payer: Self-pay | Admitting: Pain Medicine

## 2020-05-21 DIAGNOSIS — G894 Chronic pain syndrome: Secondary | ICD-10-CM

## 2020-05-24 ENCOUNTER — Encounter: Payer: Self-pay | Admitting: Hospice and Palliative Medicine

## 2020-05-25 ENCOUNTER — Other Ambulatory Visit: Payer: Self-pay | Admitting: Hospice and Palliative Medicine

## 2020-05-25 DIAGNOSIS — M62838 Other muscle spasm: Secondary | ICD-10-CM

## 2020-05-25 MED ORDER — DIAZEPAM 5 MG PO TABS: ORAL_TABLET | ORAL | 0 refills | Status: DC

## 2020-05-26 ENCOUNTER — Other Ambulatory Visit: Payer: Self-pay

## 2020-05-28 ENCOUNTER — Encounter: Payer: Self-pay | Admitting: Hospice and Palliative Medicine

## 2020-05-28 ENCOUNTER — Other Ambulatory Visit: Payer: Self-pay | Admitting: Hospice and Palliative Medicine

## 2020-05-28 MED ORDER — NITROFURANTOIN MONOHYD MACRO 100 MG PO CAPS: 100.0000 mg | ORAL_CAPSULE | ORAL | 1 refills | Status: DC

## 2020-06-24 ENCOUNTER — Ambulatory Visit: Payer: Medicare Other | Admitting: Hospice and Palliative Medicine

## 2020-07-27 ENCOUNTER — Encounter: Payer: Self-pay | Admitting: Internal Medicine

## 2020-07-27 ENCOUNTER — Other Ambulatory Visit: Payer: Self-pay

## 2020-07-27 ENCOUNTER — Ambulatory Visit: Payer: Medicare Other | Admitting: Internal Medicine

## 2020-07-27 VITALS — BP 134/82 | HR 102 | Temp 97.9°F | Resp 16 | Ht 66.0 in | Wt 198.8 lb

## 2020-07-27 DIAGNOSIS — C3401 Malignant neoplasm of right main bronchus: Secondary | ICD-10-CM | POA: Diagnosis not present

## 2020-07-27 DIAGNOSIS — R000 Tachycardia, unspecified: Secondary | ICD-10-CM | POA: Diagnosis not present

## 2020-07-27 DIAGNOSIS — E039 Hypothyroidism, unspecified: Secondary | ICD-10-CM | POA: Diagnosis not present

## 2020-07-27 DIAGNOSIS — R600 Localized edema: Secondary | ICD-10-CM

## 2020-07-27 DIAGNOSIS — R609 Edema, unspecified: Secondary | ICD-10-CM

## 2020-07-27 NOTE — Progress Notes (Signed)
[facility] [place], [place] [address]  Internal MEDICINE  Office Visit Note  Patient Name: Shawna Carrillo  [id number]  [id number]  Date of Service: 07/28/2020  Chief Complaint  Patient presents with  . Foot Swelling    bi lateral started when went to neurology visit at [facility] 2 1/2 weeks ago  . Tachycardia    started when increace of atenolol  . controlled substance policy    acknowledged  . Quality Metric Gaps    HIV screen, Hep C screen, mammogram (not doing at this time gets CT scan once a month), no paps    HPI  Pt is here with c/o bilateral lower ext edema. She has been having elevated heart rate as well. She has been on tenormin. Pt has been on [place] for high metastatic lung adenocarcinoma.( pleural disease, liver, adrenal, bone/soft tissue) with symptomatic thoracic disease who received radiation therapy in the spring of 2021 She also developed drug induced hypothyroidism and was started on Synthroid 125 mcg. Her TSH is normal however this dose seems to be lack of gradual titration. She does have low albumin on recent labs.   Current Medication: Outpatient Encounter Medications as of 07/27/2020  Medication Sig Note  . albuterol (VENTOLIN HFA) 108 (90 Base) MCG/ACT inhaler Inhale 2 puffs into the lungs every 6 (six) hours as needed for wheezing or shortness of breath.   Marland Kitchen atenolol (TENORMIN) 25 MG tablet Take 1 tablet (25 mg total) by mouth 2 (two) times daily.   . benzonatate (TESSALON) 100 MG capsule Take by mouth 3 (three) times daily as needed for cough.   . diazepam (VALIUM) 5 MG tablet TAKE 1 TABLET BY MOUTH AT BEDTIME AS NEEDED FOR ANXIETY OR MUSCLE SPASMS   . diclofenac Sodium (VOLTAREN) 1 % GEL Apply topically.   . DULoxetine (CYMBALTA) 60 MG capsule Take 1 capsule (60 mg total) by mouth daily.   . Fluticasone-Salmeterol (ADVAIR DISKUS) 250-50 MCG/DOSE AEPB Inhale 1 puff into the lungs 2 (two) times daily.   Marland Kitchen guaiFENesin-codeine 100-10  MG/5ML syrup Take 5 mLs by mouth at bedtime as needed for cough.   . levothyroxine (SYNTHROID) 125 MCG tablet Take by mouth.   . naloxone (NARCAN) nasal spray 4 mg/0.1 mL One spray in either nostril once for known/suspected opioid overdose. May repeat every 2-3 minutes in alternating nostril til EMS arrives   . omeprazole (PRILOSEC) 40 MG capsule Take by mouth.   . ondansetron (ZOFRAN) 8 MG tablet Take by mouth.   . oxyCODONE ER (XTAMPZA ER) 9 MG C12A Take by mouth.   . Oxycodone HCl 10 MG TABS Take by mouth.   . pregabalin (LYRICA) 100 MG capsule Take by mouth.   . pregabalin (LYRICA) 75 MG capsule TAKE 1 CAPSULE BY MOUTH EVERY MORNING AND 2 CAPSULES BY MOUTH EVERY NIGHT   . prochlorperazine (COMPAZINE) 10 MG tablet Take by mouth.   . prochlorperazine (COMPAZINE) 10 MG tablet Take 10 mg by mouth every 6 (six) hours as needed.   . rivaroxaban (XARELTO) 20 MG TABS tablet Take by mouth.   . [DISCONTINUED] amLODipine (NORVASC) 10 MG tablet Take 1 tablet (10 mg total) by mouth daily. (Patient not taking: Reported on 07/27/2020)   . [DISCONTINUED] calcium carbonate (CALCIUM 600) 600 MG TABS tablet Take 1 tablet (600 mg total) by mouth 2 (two) times daily with a meal. (Patient not taking: Reported on 07/27/2020)   . [DISCONTINUED] cephALEXin (KEFLEX) 500 MG capsule Take 1 capsule (500  mg total) by mouth 3 (three) times daily. (Patient not taking: Reported on 07/27/2020)   . [DISCONTINUED] Cholecalciferol (VITAMIN D3) 125 MCG (5000 UT) CAPS Take 1 capsule (5,000 Units total) by mouth daily with breakfast. Take along with calcium and magnesium. (Patient not taking: Reported on 07/27/2020)   . [DISCONTINUED] erythromycin ophthalmic ointment Place 1 application into the left eye 4 (four) times daily. (Patient not taking: Reported on 07/27/2020)   . [DISCONTINUED] ibuprofen (ADVIL,MOTRIN) 200 MG tablet Take 800 mg by mouth as needed for mild pain. (Patient not taking: Reported on 07/27/2020)   .  [DISCONTINUED] nitrofurantoin, macrocrystal-monohydrate, (MACROBID) 100 MG capsule Take 1 capsule (100 mg total) by mouth every other day. (Patient not taking: Reported on 07/27/2020)   . [DISCONTINUED] traMADol (ULTRAM) 50 MG tablet Take 2 tablets (100 mg total) by mouth every 6 (six) hours as needed. 09/08/2019: Future Prescription, NOT DUPLICATES. >>>DO NOT DELETE, even if Expired!!!<<< See Care Coordination Note from [facility] (Dr. Dossie Arbour)     No facility-administered encounter medications on file as of 07/27/2020.    Surgical History: Past Surgical History:  Procedure Laterality Date  . hilar myectomy    . LUMBAR FUSION  2012   FAILED    Medical History: Past Medical History:  Diagnosis Date  . B12 deficiency   . Carpal tunnel syndrome, bilateral   . History of anemia 07/12/2015  . History of migraine 07/12/2015  . History of peptic ulcer disease 07/12/2015  . Hypertension   . Insomnia     Family History: Family History  Problem Relation Age of Onset  . Diabetes Mother   . Heart disease Father     Social History   Socioeconomic History  . Marital status: Married    Spouse name: Not on file  . Number of children: Not on file  . Years of education: Not on file  . Highest education level: Not on file  Occupational History  . Not on file  Tobacco Use  . Smoking status: Former Smoker    Packs/day: 1.00    Types: Cigarettes  . Smokeless tobacco: Never Used  . Tobacco comment: DOES NOT SMOKE ANYMORE REPORTED ON 10/22/19  Vaping Use  . Vaping Use: Never used  Substance and Sexual Activity  . Alcohol use: No    Alcohol/week: 0.0 standard drinks  . Drug use: No  . Sexual activity: Not on file  Other Topics Concern  . Not on file  Social History Narrative  . Not on file   Social Determinants of Health   Financial Resource Strain:   . Difficulty of Paying Living Expenses: Not on file  Food Insecurity:   . Worried About Charity fundraiser in the  Last Year: Not on file  . Ran Out of Food in the Last Year: Not on file  Transportation Needs:   . Lack of Transportation (Medical): Not on file  . Lack of Transportation (Non-Medical): Not on file  Physical Activity:   . Days of Exercise per Week: Not on file  . Minutes of Exercise per Session: Not on file  Stress:   . Feeling of Stress : Not on file  Social Connections:   . Frequency of Communication with Friends and Family: Not on file  . Frequency of Social Gatherings with Friends and Family: Not on file  . Attends Religious Services: Not on file  . Active Member of Clubs or Organizations: Not on file  . Attends Archivist Meetings: Not  on file  . Marital Status: Not on file  Intimate Partner Violence:   . Fear of Current or Ex-Partner: Not on file  . Emotionally Abused: Not on file  . Physically Abused: Not on file  . Sexually Abused: Not on file      Review of Systems  Constitutional: Negative for chills, diaphoresis and fatigue.  HENT: Negative for ear pain, postnasal drip and sinus pressure.   Eyes: Negative for photophobia, discharge, redness, itching and visual disturbance.  Respiratory: Negative for cough, shortness of breath and wheezing.   Cardiovascular: Negative for chest pain, palpitations and leg swelling.  Gastrointestinal: Negative for abdominal pain, constipation, diarrhea, nausea and vomiting.  Genitourinary: Negative for dysuria and flank pain.  Musculoskeletal: Negative for arthralgias, back pain, gait problem and neck pain.  Skin: Negative for color change.  Allergic/Immunologic: Negative for environmental allergies and food allergies.  Neurological: Negative for dizziness and headaches.  Hematological: Does not bruise/bleed easily.  Psychiatric/Behavioral: Negative for agitation, behavioral problems (depression) and hallucinations.    Vital Signs: BP 134/82   Pulse (!) 102   Temp 97.9 F (36.6 C)   Resp 16   Ht 5\' 6"  (1.676 m)   Wt  198 lb 12.8 oz (90.2 kg)   SpO2 95%   BMI 32.09 kg/m    Physical Exam Constitutional:      General: She is not in acute distress.    Appearance: She is well-developed. She is not diaphoretic.  HENT:     Head: Normocephalic and atraumatic.     Mouth/Throat:     Pharynx: No oropharyngeal exudate.  Eyes:     Pupils: Pupils are equal, round, and reactive to light.  Neck:     Thyroid: No thyromegaly.     Vascular: No JVD.     Trachea: No tracheal deviation.  Cardiovascular:     Rate and Rhythm: Normal rate and regular rhythm.     Heart sounds: Normal heart sounds. No murmur heard.  No friction rub. No gallop.   Pulmonary:     Effort: Pulmonary effort is normal. No respiratory distress.     Breath sounds: No wheezing or rales.  Chest:     Chest wall: No tenderness.  Abdominal:     General: Bowel sounds are normal.     Palpations: Abdomen is soft.  Musculoskeletal:        General: Normal range of motion.     Cervical back: Normal range of motion and neck supple.     Right lower leg: Edema present.     Left lower leg: Edema present.  Lymphadenopathy:     Cervical: No cervical adenopathy.  Skin:    General: Skin is warm and dry.  Neurological:     Mental Status: She is alert and oriented to person, place, and time.     Cranial Nerves: No cranial nerve deficit.  Psychiatric:        Behavior: Behavior normal.        Thought Content: Thought content normal.        Judgment: Judgment normal.    Assessment/Plan: 1. Bilateral lower extremity edema Her Albumin is low on recent labs, will need urine- protein ratio, need to look into side effects of her chemotherapeutic agents vs metastatic disease  - Urine Microalbumin w/creat. ratio  2. Tachycardia with heart rate 100-120 beats per minute Pt was started on Synthroid 125 mcg without any titration dose, tachycardia can be associated to that, however might need echo to  look at LV function  3. Malignant neoplasm of hilus of  right lung (HCC) Followed by [facility]   4. Acquired hypothyroidism Pt is instructed hold synthroid for few days and then start taking half tab a day, need to recheck TSH and free T4 in 4 weeks   General Counseling: Feige verbalizes understanding of the findings of todays visit and agrees with plan of treatment. I have discussed any further diagnostic evaluation that may be needed or ordered today. We also reviewed her medications today. she has been encouraged to call the office with any questions or concerns that should arise related to todays visit.    Orders Placed This Encounter  Procedures  . Urine Microalbumin w/creat. ratio    Total time spent: 30 Minutes Time spent includes review of chart, medications, test results, and follow up plan with the patient.      Dr Lavera Guise Internal medicine

## 2020-07-28 LAB — MICROALBUMIN / CREATININE URINE RATIO
Creatinine, Urine: 69.6 mg/dL
Microalb/Creat Ratio: <4 mg/g creat (ref 0–29)
Microalbumin, Urine: <3 ug/mL

## 2020-07-30 ENCOUNTER — Other Ambulatory Visit: Payer: Self-pay | Admitting: Internal Medicine

## 2020-07-30 DIAGNOSIS — R600 Localized edema: Secondary | ICD-10-CM

## 2020-08-09 ENCOUNTER — Ambulatory Visit: Payer: Medicare Other | Admitting: Hospice and Palliative Medicine

## 2020-08-10 ENCOUNTER — Other Ambulatory Visit: Payer: Self-pay

## 2020-08-10 ENCOUNTER — Telehealth: Payer: Self-pay

## 2020-08-10 ENCOUNTER — Encounter: Payer: Self-pay | Admitting: Internal Medicine

## 2020-08-10 ENCOUNTER — Ambulatory Visit: Payer: Medicare Other | Admitting: Internal Medicine

## 2020-08-10 DIAGNOSIS — E039 Hypothyroidism, unspecified: Secondary | ICD-10-CM

## 2020-08-10 DIAGNOSIS — R600 Localized edema: Secondary | ICD-10-CM | POA: Diagnosis not present

## 2020-08-10 DIAGNOSIS — I10 Essential (primary) hypertension: Secondary | ICD-10-CM | POA: Diagnosis not present

## 2020-08-10 DIAGNOSIS — C3401 Malignant neoplasm of right main bronchus: Secondary | ICD-10-CM

## 2020-08-10 DIAGNOSIS — M62838 Other muscle spasm: Secondary | ICD-10-CM

## 2020-08-10 LAB — URINALYSIS, ROUTINE W REFLEX MICROSCOPIC
Bilirubin, UA: NEGATIVE
Glucose, UA: NEGATIVE
Ketones, UA: NEGATIVE
Nitrite, UA: NEGATIVE
Protein,UA: NEGATIVE
RBC, UA: NEGATIVE
Specific Gravity, UA: 1.008 (ref 1.005–1.030)
Urobilinogen, Ur: 0.2 mg/dL (ref 0.2–1.0)
pH, UA: 6.5 (ref 5.0–7.5)

## 2020-08-10 LAB — MICROALBUMIN / CREATININE URINE RATIO
Microalb/Creat Ratio: <10 mg/g creat (ref 0–29)
Microalbumin, Urine: <3 ug/mL

## 2020-08-10 LAB — MICROSCOPIC EXAMINATION
Bacteria, UA: NONE SEEN
Casts: NONE SEEN /lpf
RBC, Urine: NONE SEEN /hpf (ref 0–2)

## 2020-08-10 LAB — PROTEIN / CREATININE RATIO, URINE
Creatinine, Urine: 29.6 mg/dL
Protein, Ur: 8.9 mg/dL
Protein/Creat Ratio: 301 mg/g creat — ABNORMAL HIGH (ref 0–200)

## 2020-08-10 MED ORDER — POTASSIUM CHLORIDE ER 8 MEQ PO TBCR: EXTENDED_RELEASE_TABLET | ORAL | 3 refills | Status: AC

## 2020-08-10 MED ORDER — DIAZEPAM 5 MG PO TABS: ORAL_TABLET | ORAL | 0 refills | Status: AC

## 2020-08-10 MED ORDER — FUROSEMIDE 20 MG PO TABS: ORAL_TABLET | ORAL | 3 refills | Status: AC

## 2020-08-10 NOTE — Progress Notes (Signed)
[facility] [place], [place] [address]  Internal MEDICINE  Office Visit Note  Patient Name: Shawna Carrillo  [id number]  [id number]  Date of Service: 08/10/2020  Chief Complaint  Patient presents with  . Follow-up    discuss BP and pulse, refill request  . Hypertension  . policy update form    received  . Quality Metric Gaps    pap    HPI Pt is here for routine follow up. She was seen for ongoing symptoms of palpitation sand tachycardia. She is also having BLE. Her synthroid dose was reduced, takes half of Synthroid 125 mcg once day. She is monitoring her blood pressure at home. BP is slightly elevated. Serum albumin is low, mild BLE. Pt has been on [place] for high metastatic lung adenocarcinoma.( pleural disease, liver, adrenal, bone/soft tissue) with symptomatic thoracic disease who received radiation therapy in the spring of 2021 She will like to have refill on her valium  Current Medication: Outpatient Encounter Medications as of 08/10/2020  Medication Sig  . albuterol (VENTOLIN HFA) 108 (90 Base) MCG/ACT inhaler Inhale 2 puffs into the lungs every 6 (six) hours as needed for wheezing or shortness of breath.  Marland Kitchen atenolol (TENORMIN) 25 MG tablet Take 1 tablet (25 mg total) by mouth 2 (two) times daily.  . benzonatate (TESSALON) 100 MG capsule Take by mouth 3 (three) times daily as needed for cough.  . diazepam (VALIUM) 5 MG tablet TAKE 1 TABLET BY MOUTH AT BEDTIME AS NEEDED FOR ANXIETY OR MUSCLE SPASMS  . diclofenac Sodium (VOLTAREN) 1 % GEL Apply topically.  . DULoxetine (CYMBALTA) 60 MG capsule Take 1 capsule (60 mg total) by mouth daily.  . Fluticasone-Salmeterol (ADVAIR DISKUS) 250-50 MCG/DOSE AEPB Inhale 1 puff into the lungs 2 (two) times daily.  . furosemide (LASIX) 20 MG tablet Take one tab M/w/F as needed for fluid  . guaiFENesin-codeine 100-10 MG/5ML syrup Take 5 mLs by mouth at bedtime as needed for cough.  . levothyroxine (SYNTHROID) 125  MCG tablet Take by mouth.  . naloxone (NARCAN) nasal spray 4 mg/0.1 mL One spray in either nostril once for known/suspected opioid overdose. May repeat every 2-3 minutes in alternating nostril til EMS arrives  . omeprazole (PRILOSEC) 40 MG capsule Take by mouth.  . ondansetron (ZOFRAN) 8 MG tablet Take by mouth.  . oxyCODONE ER (XTAMPZA ER) 9 MG C12A Take by mouth.  . Oxycodone HCl 10 MG TABS Take by mouth.  . potassium chloride (KLOR-CON) 8 MEQ tablet Take one tab as needed with Lasix MW/F  . pregabalin (LYRICA) 100 MG capsule Take by mouth.  . pregabalin (LYRICA) 75 MG capsule TAKE 1 CAPSULE BY MOUTH EVERY MORNING AND 2 CAPSULES BY MOUTH EVERY NIGHT  . prochlorperazine (COMPAZINE) 10 MG tablet Take by mouth.  . prochlorperazine (COMPAZINE) 10 MG tablet Take 10 mg by mouth every 6 (six) hours as needed.  . rivaroxaban (XARELTO) 20 MG TABS tablet Take by mouth.  . [DISCONTINUED] diazepam (VALIUM) 5 MG tablet TAKE 1 TABLET BY MOUTH AT BEDTIME AS NEEDED FOR ANXIETY OR MUSCLE SPASMS   No facility-administered encounter medications on file as of 08/10/2020.    Surgical History: Past Surgical History:  Procedure Laterality Date  . hilar myectomy    . LUMBAR FUSION  2012   FAILED    Medical History: Past Medical History:  Diagnosis Date  . B12 deficiency   . Carpal tunnel syndrome, bilateral   . History of anemia 07/12/2015  .  History of migraine 07/12/2015  . History of peptic ulcer disease 07/12/2015  . Hypertension   . Insomnia     Family History: Family History  Problem Relation Age of Onset  . Diabetes Mother   . Heart disease Father     Social History   Socioeconomic History  . Marital status: Married    Spouse name: Not on file  . Number of children: Not on file  . Years of education: Not on file  . Highest education level: Not on file  Occupational History  . Not on file  Tobacco Use  . Smoking status: Former Smoker    Packs/day: 1.00    Types: Cigarettes   . Smokeless tobacco: Never Used  . Tobacco comment: DOES NOT SMOKE ANYMORE REPORTED ON 10/22/19  Vaping Use  . Vaping Use: Never used  Substance and Sexual Activity  . Alcohol use: No    Alcohol/week: 0.0 standard drinks  . Drug use: No  . Sexual activity: Not on file  Other Topics Concern  . Not on file  Social History Narrative  . Not on file   Social Determinants of Health   Financial Resource Strain:   . Difficulty of Paying Living Expenses: Not on file  Food Insecurity:   . Worried About Charity fundraiser in the Last Year: Not on file  . Ran Out of Food in the Last Year: Not on file  Transportation Needs:   . Lack of Transportation (Medical): Not on file  . Lack of Transportation (Non-Medical): Not on file  Physical Activity:   . Days of Exercise per Week: Not on file  . Minutes of Exercise per Session: Not on file  Stress:   . Feeling of Stress : Not on file  Social Connections:   . Frequency of Communication with Friends and Family: Not on file  . Frequency of Social Gatherings with Friends and Family: Not on file  . Attends Religious Services: Not on file  . Active Member of Clubs or Organizations: Not on file  . Attends Archivist Meetings: Not on file  . Marital Status: Not on file  Intimate Partner Violence:   . Fear of Current or Ex-Partner: Not on file  . Emotionally Abused: Not on file  . Physically Abused: Not on file  . Sexually Abused: Not on file      Review of Systems  Constitutional: Negative for chills, diaphoresis and fatigue.  HENT: Negative for ear pain, postnasal drip and sinus pressure.   Eyes: Negative for photophobia, discharge, redness, itching and visual disturbance.  Respiratory: Negative for cough, shortness of breath and wheezing.   Cardiovascular: Positive for leg swelling. Negative for chest pain and palpitations.  Gastrointestinal: Negative for abdominal pain, constipation, diarrhea, nausea and vomiting.   Genitourinary: Negative for dysuria and flank pain.  Musculoskeletal: Positive for arthralgias, back pain and myalgias. Negative for gait problem and neck pain.  Skin: Negative for color change.  Allergic/Immunologic: Negative for environmental allergies and food allergies.  Neurological: Negative for dizziness and headaches.  Hematological: Does not bruise/bleed easily.  Psychiatric/Behavioral: Negative for agitation, behavioral problems (depression) and hallucinations.    Vital Signs: BP 132/83   Pulse 97   Temp (!) 97.1 F (36.2 C)   Resp 16   Ht 5\' 6"  (1.676 m)   Wt 196 lb 9.6 oz (89.2 kg)   SpO2 92%   BMI 31.73 kg/m    Physical Exam Constitutional:      General:  She is not in acute distress.    Appearance: She is well-developed. She is not diaphoretic.  HENT:     Head: Normocephalic and atraumatic.     Mouth/Throat:     Pharynx: No oropharyngeal exudate.  Eyes:     Pupils: Pupils are equal, round, and reactive to light.  Neck:     Thyroid: No thyromegaly.     Vascular: No JVD.     Trachea: No tracheal deviation.  Cardiovascular:     Rate and Rhythm: Normal rate and regular rhythm.     Heart sounds: Normal heart sounds. No murmur heard.  No friction rub. No gallop.   Pulmonary:     Effort: Pulmonary effort is normal. No respiratory distress.     Breath sounds: No wheezing or rales.  Chest:     Chest wall: No tenderness.  Abdominal:     General: Bowel sounds are normal.     Palpations: Abdomen is soft.  Musculoskeletal:        General: Normal range of motion.     Cervical back: Normal range of motion and neck supple.     Right lower leg: Edema present.     Left lower leg: Edema present.  Lymphadenopathy:     Cervical: No cervical adenopathy.  Skin:    General: Skin is warm and dry.  Neurological:     Mental Status: She is alert and oriented to person, place, and time.     Cranial Nerves: No cranial nerve deficit.  Psychiatric:        Behavior:  Behavior normal.        Thought Content: Thought content normal.        Judgment: Judgment normal.    Assessment/Plan: 1. Essential hypertension Improved numbers with few episodes of tachycardia, she is instructed to take additional half tab prn 2 x day for those episodes   2. Muscle spasm Refills are provided  - diazepam (VALIUM) 5 MG tablet; TAKE 1 TABLET BY MOUTH AT BEDTIME AS NEEDED FOR ANXIETY OR MUSCLE SPASMS  Dispense: 90 tablet; Refill: 0  3. 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 Bilateral lower extremity edema Pending microglobulin levels, will monitor, Start low dose Lasix,  - furosemide (LASIX) 20 MG tablet; Take one tab M/w/F as needed for fluid  Dispense: 30 tablet; Refill: 3 - potassium chloride (KLOR-CON) 8 MEQ tablet; Take one tab as needed with Lasix MW/F  Dispense: 30 tablet; Refill: 3 - Basic Metabolic Panel (BMET)  5. Acquired hypothyroidism Will need future monitoring of TSH and free T4 with gradual and minimum titration   General Counseling: Vanya verbalizes understanding of the findings of todays visit and agrees with plan of treatment. I have discussed any further diagnostic evaluation that may be needed or ordered today. We also reviewed her medications today. she has been encouraged to call the office with any questions or concerns that should arise related to todays visit.    Orders Placed This Encounter  Procedures  . Basic Metabolic Panel (BMET)    Meds ordered this encounter  Medications  . furosemide (LASIX) 20 MG tablet    Sig: Take one tab M/w/F as needed for fluid    Dispense:  30 tablet    Refill:  3  . potassium chloride (KLOR-CON) 8 MEQ tablet    Sig: Take one tab as needed with Lasix MW/F    Dispense:  30 tablet    Refill:  3  .  diazepam (VALIUM) 5 MG tablet    Sig: TAKE 1 TABLET BY MOUTH AT BEDTIME AS NEEDED FOR ANXIETY OR MUSCLE SPASMS    Dispense:  90 tablet    Refill:  0    Total time  spent:35Minutes Time spent includes review of chart, medications, test results, and follow up plan with the patient.      Dr Lavera Guise Internal medicine

## 2020-08-10 NOTE — Telephone Encounter (Signed)
-----   Message from Shawna Guise, MD sent at 08/10/2020  2:21 PM EST ----- Her urine proteins are positive, does she want to see kidney doctor or wait until seen by cancer doctor

## 2020-08-10 NOTE — Telephone Encounter (Signed)
Ok thank u

## 2020-09-21 ENCOUNTER — Encounter: Payer: Medicare Other | Admitting: Internal Medicine

## 2020-09-24 ENCOUNTER — Telehealth: Payer: Self-pay

## 2020-09-24 NOTE — Telephone Encounter (Signed)
Lmom to rs missed ov from 09-21-20. Needs to be corrected to a Medicare Wellness  [place]

## 2020-10-21 ENCOUNTER — Other Ambulatory Visit: Payer: Self-pay

## 2020-10-21 DIAGNOSIS — I10 Essential (primary) hypertension: Secondary | ICD-10-CM

## 2020-10-21 MED ORDER — ATENOLOL 25 MG PO TABS: 25.0000 mg | ORAL_TABLET | Freq: Two times a day (BID) | ORAL | 0 refills | Status: DC

## 2020-10-23 IMAGING — CT CT CHEST W/ CM
2 of 4 series · 15 of 36 positions shown, 18 images · IV contrast (omnipaque)
Comparison: Multiple chest radiographs, most recent 11/03/2019.

CLINICAL DATA: Abnormal chest radiograph.  Cough.

EXAM:
CT CHEST WITH CONTRAST
TECHNIQUE: Multidetector CT imaging of the chest was performed during
intravenous contrast administration.
CONTRAST:  75mL OMNIPAQUE IOHEXOL 300 MG/ML  SOLN

[Series 2: axial chest 2.00 · axial · 0.73mm/px · z∈[-1184,-892]mm · 12 of 174 slices shown, 15 images]
[im 14/174  mediastinal]
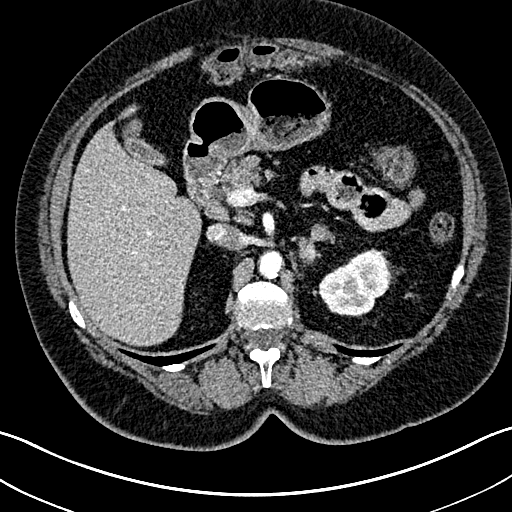
[im 14/174  lung]
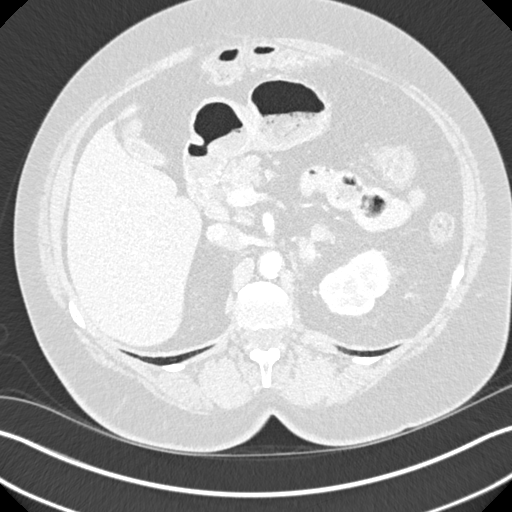
[im 27/174  lung]
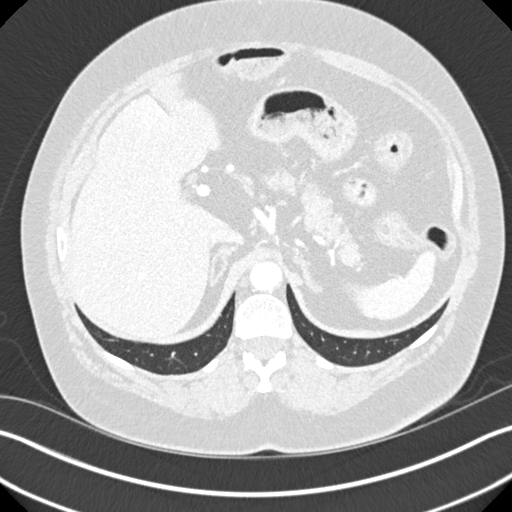
[im 40/174  lung]
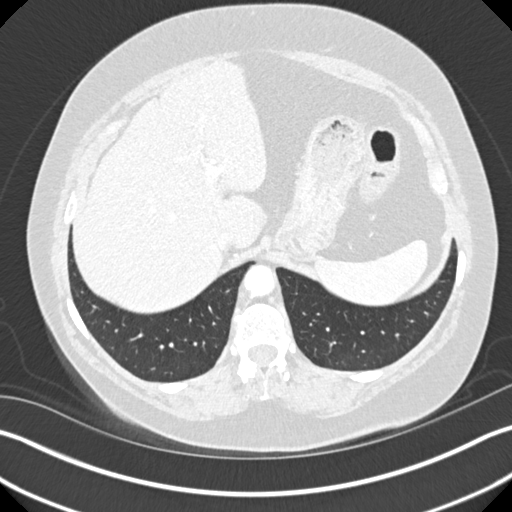
[im 54/174  lung]
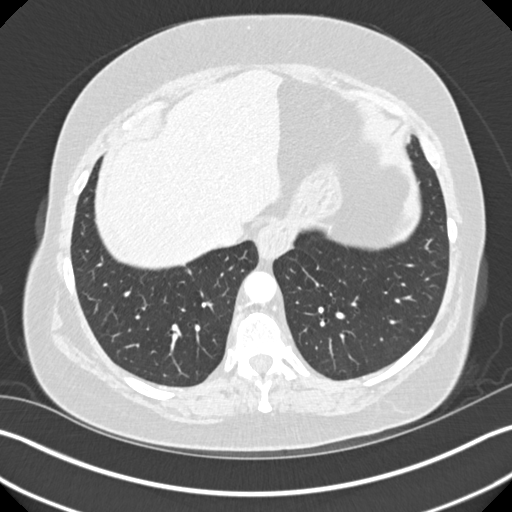
[im 67/174  mediastinal]
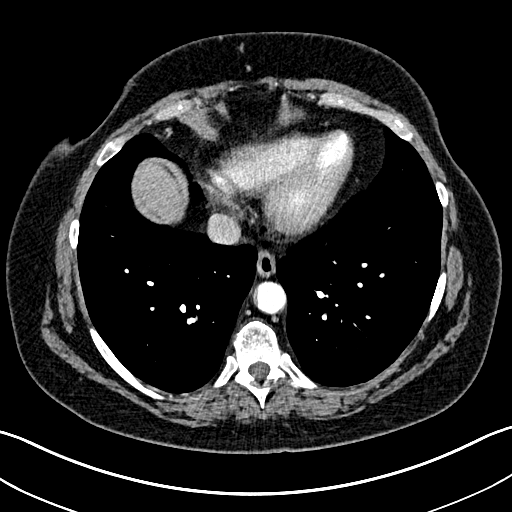
[im 67/174  lung]
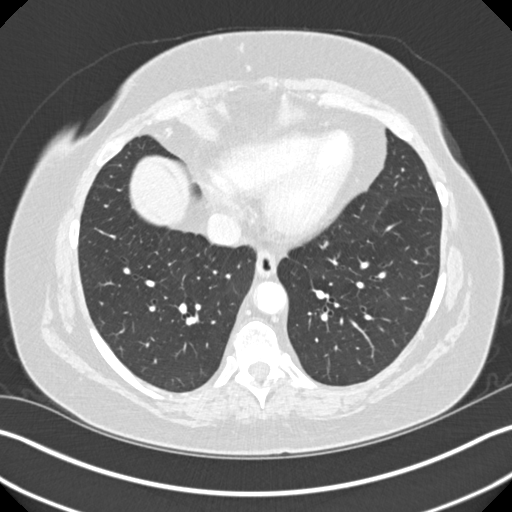
[im 80/174  lung]
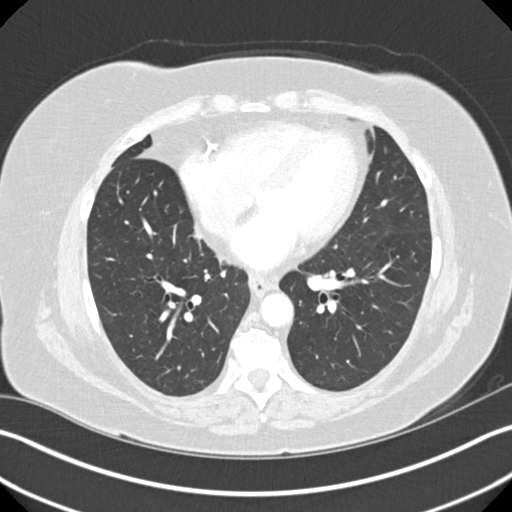
[im 94/174  lung]
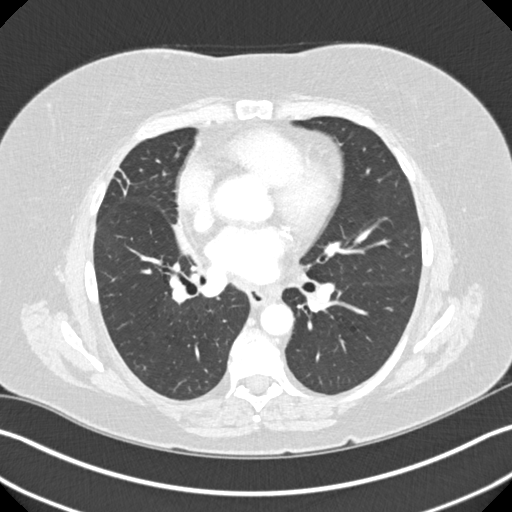
[im 107/174  lung]
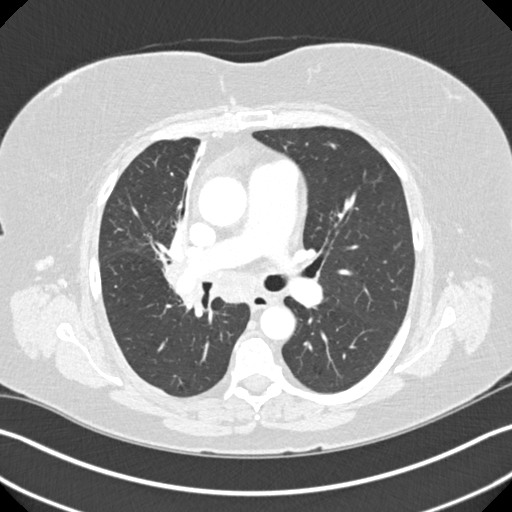
[im 120/174  mediastinal]
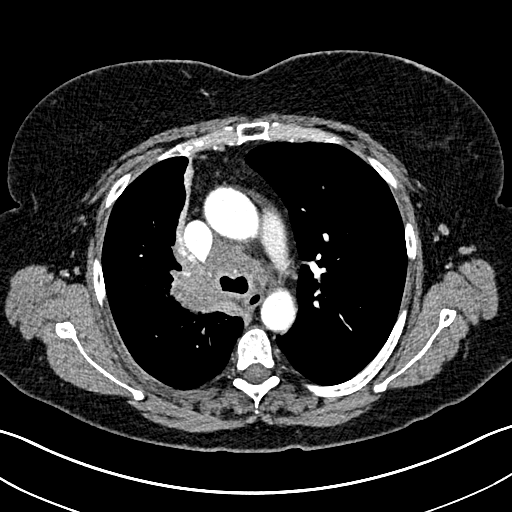
[im 120/174  lung]
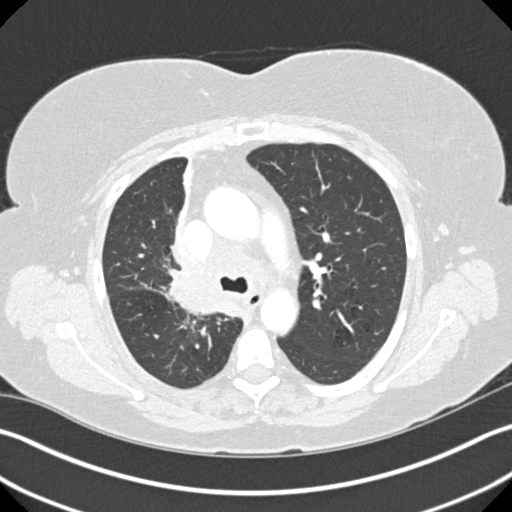
[im 134/174  lung]
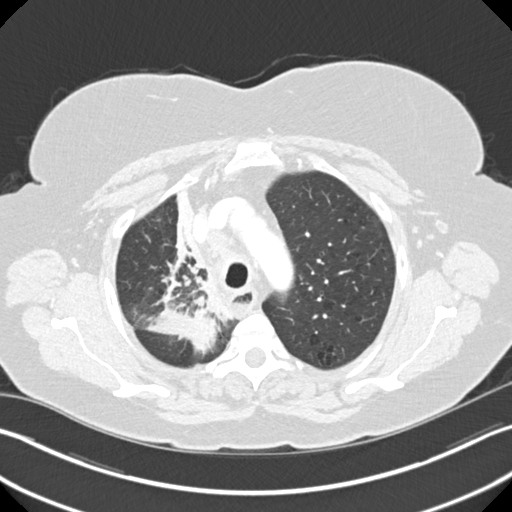
[im 147/174  lung]
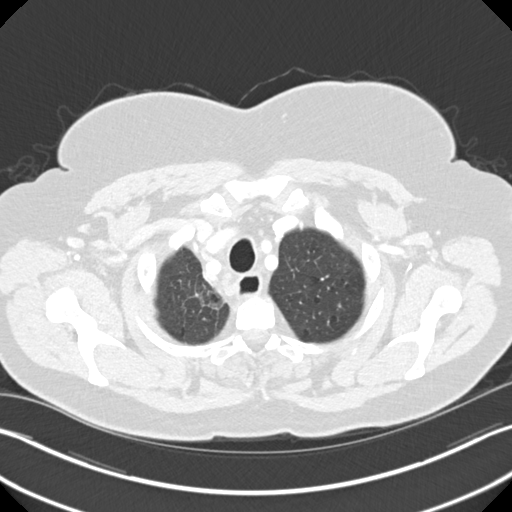
[im 160/174  lung]
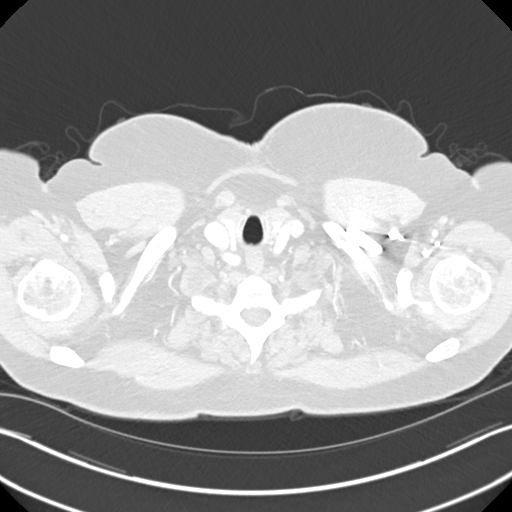

[Series 4: coronal chest 2.00 cor · coronal · 0.68mm/px · 3 of 163 slices shown]
[im 33/163  lung]
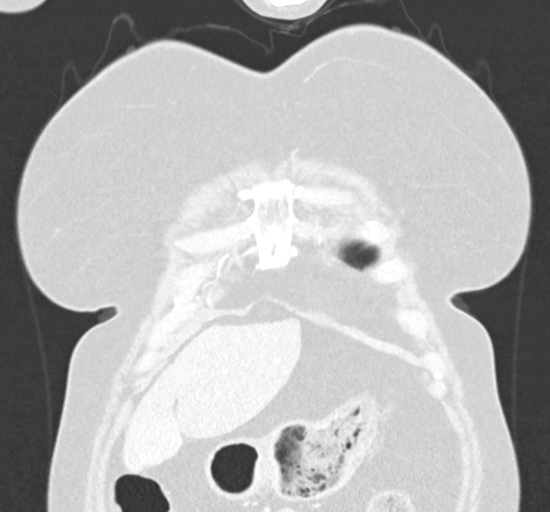
[im 65/163  lung]
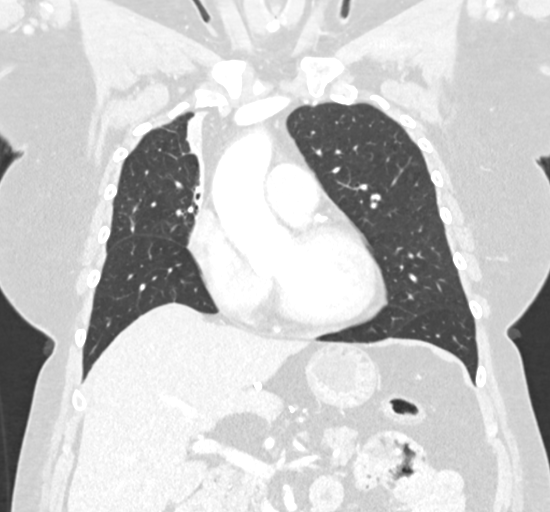
[im 98/163  lung]
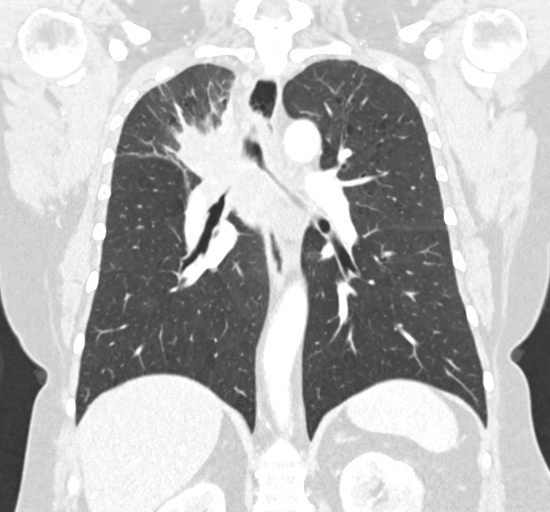

[15 of 36 positions shown; findings below may reference images not displayed]

FINDINGS: Cardiovascular: Aortic atherosclerosis. Normal heart size, without
pericardial effusion. Multivessel coronary artery atherosclerosis.
No central pulmonary embolism, on this non-dedicated study.

Pulmonary artery enlargement, outflow tract 3.3 cm.

Mediastinum/Nodes: No supraclavicular adenopathy. Right paratracheal
adenopathy at 1.7 cm on 39/2. Adenopathy versus direct tumor
extension surrounding the carina, including on 57/2.

Extension in the subcarinal station at 1.9 cm on 65/2.

No left hilar adenopathy.

Tiny hiatal hernia.

Upper esophageal dilatation with fluid level within.

Lungs/Pleura: Mass-effect upon right upper lobe bronchi, which
remain patent.

Central posterior right upper lobe lung mass with surrounding
postobstructive pneumonitis. Measures on the order of 5.0 x 4.9 cm
on 50/3. 5.7 cm craniocaudal on sagittal image 73.

Underlying centrilobular emphysema.

Superior segment right lower lobe probable mucoid impaction versus a
central pulmonary nodule. Example 1.2 cm on 61/3.

Probable scarring in the lingula at 8 mm on 119/3.

4 mm right lower lobe pulmonary nodule on 120/3.

Upper Abdomen: Right hepatic lobe hypoattenuating 9 mm lesion on
153/2. Normal imaged portions of the spleen, pancreas, gallbladder,
right adrenal gland.

Left adrenal nodularity, with the largest nodule measuring 1.4 cm on
159/2.

Infiltrative hypoenhancement or hypoattenuation within the left
kidney measures up to 3.8 cm on 173/2. There is also suggestion of a
hypoenhancing area in the upper pole right kidney.

Musculoskeletal: No acute osseous abnormality.
IMPRESSION: 1. Right upper lobe primary bronchogenic carcinoma with direct tumor
extension into the mediastinum and thoracic adenopathy. Recommend
multidisciplinary thoracic oncology consultation for eventual PET
and sampling.
2. Indeterminate left adrenal and hepatic lesions.
3. Incompletely imaged hypoenhancement within both kidneys.
Considerations include pyelonephritis, metastatic disease, or
infiltrative primary neoplasm. Consider dedicated pre and post
contrast abdominal MRI. This will allow evaluation of the liver,
left adrenal gland, and kidneys.
4. Age advanced coronary artery atherosclerosis. Recommend
assessment of coronary risk factors and consideration of medical
therapy.
5. Aortic atherosclerosis ([id number]) and emphysema ([id number]).
6. Pulmonary artery enlargement suggests pulmonary arterial
hypertension.
7. Esophageal air fluid level suggests dysmotility or
gastroesophageal reflux.

## 2020-10-25 ENCOUNTER — Other Ambulatory Visit: Payer: Self-pay | Admitting: Hospice and Palliative Medicine

## 2020-10-25 DIAGNOSIS — I10 Essential (primary) hypertension: Secondary | ICD-10-CM

## 2021-01-27 ENCOUNTER — Encounter: Payer: Self-pay | Admitting: Internal Medicine

## 2021-01-27 ENCOUNTER — Other Ambulatory Visit: Payer: Self-pay | Admitting: Internal Medicine

## 2021-01-27 DIAGNOSIS — I10 Essential (primary) hypertension: Secondary | ICD-10-CM

## 2021-01-27 MED ORDER — ATENOLOL 25 MG PO TABS: ORAL_TABLET | ORAL | 0 refills | Status: DC

## 2021-02-02 ENCOUNTER — Other Ambulatory Visit: Payer: Self-pay | Admitting: Internal Medicine

## 2021-02-02 DIAGNOSIS — I10 Essential (primary) hypertension: Secondary | ICD-10-CM

## 2021-05-03 ENCOUNTER — Other Ambulatory Visit: Payer: Self-pay | Admitting: Internal Medicine

## 2021-05-03 ENCOUNTER — Encounter: Payer: Self-pay | Admitting: Internal Medicine

## 2021-05-03 DIAGNOSIS — I10 Essential (primary) hypertension: Secondary | ICD-10-CM

## 2021-05-03 NOTE — Telephone Encounter (Signed)
Please look into this, Does she need awv or f/u

## 2021-05-17 ENCOUNTER — Telehealth: Payer: Self-pay

## 2021-05-17 NOTE — Telephone Encounter (Signed)
Called patient and left a voicemail In regards to prescription request that was denied. Patient has not been seen since November of 2021. Patient no showed annual wellness visit in January 2022. Appointment needs to be rescheduled and patient needs to be seen to have prescriptions refilled. Patient advised via voicemail.

## 2022-02-12 ENCOUNTER — Other Ambulatory Visit: Payer: Self-pay | Admitting: Internal Medicine

## 2022-02-12 DIAGNOSIS — I10 Essential (primary) hypertension: Secondary | ICD-10-CM

## 2022-07-12 DEATH — deceased
